# Patient Record
Sex: Female | Born: 1990 | Race: Black or African American | Hispanic: No | Marital: Single | Smoking: Never smoker
Health system: Southern US, Community
[De-identification: ages and names within clinical notes are randomized; demographics above are authoritative.]

## PROBLEM LIST (undated history)

## (undated) ENCOUNTER — Inpatient Hospital Stay (HOSPITAL_COMMUNITY): Payer: Self-pay

## (undated) DIAGNOSIS — M419 Scoliosis, unspecified: Secondary | ICD-10-CM

## (undated) DIAGNOSIS — Z3490 Encounter for supervision of normal pregnancy, unspecified, unspecified trimester: Secondary | ICD-10-CM

## (undated) DIAGNOSIS — Z789 Other specified health status: Secondary | ICD-10-CM

## (undated) HISTORY — PX: BACK SURGERY: SHX140

---

## 2012-05-30 ENCOUNTER — Inpatient Hospital Stay (HOSPITAL_COMMUNITY): Payer: BC Managed Care – PPO

## 2012-05-30 ENCOUNTER — Inpatient Hospital Stay (HOSPITAL_COMMUNITY)
Admission: AD | Admit: 2012-05-30 | Discharge: 2012-05-30 | Disposition: A | Discharge status: Home / Self Care | Payer: BC Managed Care – PPO | Source: Ambulatory Visit | Attending: Obstetrics & Gynecology | Admitting: Obstetrics & Gynecology

## 2012-05-30 ENCOUNTER — Encounter (HOSPITAL_COMMUNITY): Payer: Self-pay

## 2012-05-30 DIAGNOSIS — O99891 Other specified diseases and conditions complicating pregnancy: Secondary | ICD-10-CM | POA: Insufficient documentation

## 2012-05-30 DIAGNOSIS — O26899 Other specified pregnancy related conditions, unspecified trimester: Secondary | ICD-10-CM

## 2012-05-30 DIAGNOSIS — R109 Unspecified abdominal pain: Secondary | ICD-10-CM

## 2012-05-30 LAB — URINE MICROSCOPIC-ADD ON

## 2012-05-30 LAB — CBC
HCT: 33.9 % — ABNORMAL LOW (ref 36.0–46.0)
Hemoglobin: 10.9 g/dL — ABNORMAL LOW (ref 12.0–15.0)
MCH: 25.5 pg — ABNORMAL LOW (ref 26.0–34.0)
MCHC: 32.2 g/dL (ref 30.0–36.0)
MCV: 79.2 fL (ref 78.0–100.0)
Platelets: 197 K/uL (ref 150–400)
RBC: 4.28 MIL/uL (ref 3.87–5.11)
RDW: 13.8 % (ref 11.5–15.5)
WBC: 5.9 K/uL (ref 4.0–10.5)

## 2012-05-30 LAB — URINALYSIS, ROUTINE W REFLEX MICROSCOPIC
Nitrite: NEGATIVE
Specific Gravity, Urine: >1.03 — ABNORMAL HIGH (ref 1.005–1.030)
Urobilinogen, UA: 0.2 mg/dL (ref 0.0–1.0)

## 2012-05-30 LAB — HCG, QUANTITATIVE, PREGNANCY: hCG, Beta Chain, Quant, S: 5386 m[IU]/mL — ABNORMAL HIGH (ref <5)

## 2012-05-30 LAB — WET PREP, GENITAL

## 2012-05-30 LAB — POCT PREGNANCY, URINE: Preg Test, Ur: POSITIVE — AB

## 2012-05-30 NOTE — MAU Provider Note (Signed)
History     CSN: [id number]  Arrival date and time: 05/30/12 1644   First Provider Initiated Contact with Patient 05/30/12 1843      Chief Complaint  Patient presents with  . Abdominal Cramping   HPI  Pt is [redacted]w[redacted]d pregnant and presents with abdominal pain 3 days.  The pain comes and goes.  The pt has not taken anything for the pain.  Pt denies spotting or bleeding.  Pt denies nausea.  Pt denies pain with urination or constipation/diarrhea.  Pt last had sex when she got pregnant.  Pt denies fever or chills.  Pt has a new OB appointment scheduled with Dr. Langston Masker  History reviewed. No pertinent past medical history.  Past Surgical History  Procedure Date  . Back surgery     History reviewed. No pertinent family history.  History  Substance Use Topics  . Smoking status: Never Smoker   . Smokeless tobacco: Not on file  . Alcohol Use:     Allergies: No Known Allergies  No prescriptions prior to admission    Review of Systems  Constitutional: Negative for fever and chills.  Gastrointestinal: Positive for abdominal pain. Negative for nausea, vomiting, diarrhea and constipation.  Genitourinary: Negative for dysuria, urgency and frequency.  Neurological: Negative for headaches.   Physical Exam   Blood pressure 127/76, pulse 75, temperature 98.6 F (37 C), temperature source Oral, resp. rate 16, height 5' (1.524 m), weight 133 lb 2 oz (60.385 kg), last menstrual period 04/22/2012.  Physical Exam  Vitals reviewed. Constitutional: She is oriented to person, place, and time. She appears well-developed and well-nourished.  HENT:  Head: Normocephalic.  Eyes: Pupils are equal, round, and reactive to light.  Neck: Normal range of motion. Neck supple.  Cardiovascular: Normal rate.   Respiratory: Effort normal.  GI: Soft. She exhibits no distension. There is tenderness. There is no rebound and no guarding.       RLQ tender with palpation- no rebound  Genitourinary: Vagina  normal and uterus normal. No vaginal discharge found.       Mildly tender right adnexa  Musculoskeletal: Normal range of motion.  Neurological: She is alert and oriented to person, place, and time.  Skin: Skin is warm and dry.  Psychiatric: She has a normal mood and affect.    MAU Course  Procedures Results for orders placed during the hospital encounter of 05/30/12 (from the past 24 hour(s))  URINALYSIS, ROUTINE W REFLEX MICROSCOPIC     Status: Abnormal   Collection Time   05/30/12  5:00 PM      Component Value Range   Color, Urine YELLOW  YELLOW   APPearance CLEAR  CLEAR   Specific Gravity, Urine >1.030 (*) 1.005 - 1.030   pH 6.0  5.0 - 8.0   Glucose, UA NEGATIVE  NEGATIVE mg/dL   Hgb urine dipstick NEGATIVE  NEGATIVE   Bilirubin Urine NEGATIVE  NEGATIVE   Ketones, ur NEGATIVE  NEGATIVE mg/dL   Protein, ur NEGATIVE  NEGATIVE mg/dL   Urobilinogen, UA 0.2  0.0 - 1.0 mg/dL   Nitrite NEGATIVE  NEGATIVE   Leukocytes, UA TRACE (*) NEGATIVE  URINE MICROSCOPIC-ADD ON     Status: Abnormal   Collection Time   05/30/12  5:00 PM      Component Value Range   Squamous Epithelial / LPF FEW (*) RARE   WBC, UA 11-20  <3 WBC/hpf   RBC / HPF 3-6  <3 RBC/hpf   Bacteria, UA MANY (*) RARE  Urine-Other MUCOUS PRESENT    POCT PREGNANCY, URINE     Status: Abnormal   Collection Time   05/30/12  5:21 PM      Component Value Range   Preg Test, Ur POSITIVE (*) NEGATIVE  HCG, QUANTITATIVE, PREGNANCY     Status: Abnormal   Collection Time   05/30/12  6:40 PM      Component Value Range   hCG, Beta Chain, Quant, S 5386 (*) <5 mIU/mL  CBC     Status: Abnormal   Collection Time   05/30/12  6:40 PM      Component Value Range   WBC 5.9  4.0 - 10.5 K/uL   RBC 4.28  3.87 - 5.11 MIL/uL   Hemoglobin 10.9 (*) 12.0 - 15.0 g/dL   HCT 86.5 (*) 78.4 - 69.6 %   MCV 79.2  78.0 - 100.0 fL   MCH 25.5 (*) 26.0 - 34.0 pg   MCHC 32.2  30.0 - 36.0 g/dL   RDW 29.5  28.4 - 13.2 %   Platelets 197  150 - 400 K/uL    RADIOLOGY REPORT*  Clinical Data: Left-sided pain  OBSTETRIC <14 WK [place] AND TRANSVAGINAL OB US  Technique: Both transabdominal and transvaginal ultrasound  examinations were performed for complete evaluation of the  gestation as well as the maternal uterus, adnexal regions, and  pelvic cul-de-sac. Transvaginal technique was performed to assess  early pregnancy.  Comparison: None.  Intrauterine gestational sac: Visualized  Yolk sac: Visualized  Embryo: Not visualized  Cardiac Activity: Not available  Mean sac diameter measuring 7 mm corresponding to gestational age [redacted]  weeks 2 days. [place] EDC: 01/28/2013  Maternal uterus/adnexae:  No subchorionic hemorrhage.  Right ovary measures 5.1 x 4 x 4.6 cm. There is probable corpus  luteum cyst measures 1.8 cm.  Left ovary measures 3.5 x 1.6 x 2.3 cm.  Small amount of pelvic free fluid is noted.  IMPRESSION:  1. There is a single intrauterine gestation with mean sac diameter  measuring 7 mm corresponding to gestational age [redacted] weeks 2 days. No  embryonic pole is noted. A yolk sac is identified. EDC by  ultrasound is 01/28/2013.  No subchorionic hemorrhage. Probable corpus luteum cyst in the  right ovary measures 1.8 cm. Small pelvic free fluid.  Follow-up examination and correlation with beta HCG level is  recommended to assure viability.  Original Report Authenticated By: Natasha Mead, M.D. Results for orders placed during the hospital encounter of 05/30/12 (from the past 24 hour(s))  URINALYSIS, ROUTINE W REFLEX MICROSCOPIC     Status: Abnormal   Collection Time   05/30/12  5:00 PM      Component Value Range   Color, Urine YELLOW  YELLOW   APPearance CLEAR  CLEAR   Specific Gravity, Urine >1.030 (*) 1.005 - 1.030   pH 6.0  5.0 - 8.0   Glucose, UA NEGATIVE  NEGATIVE mg/dL   Hgb urine dipstick NEGATIVE  NEGATIVE   Bilirubin Urine NEGATIVE  NEGATIVE   Ketones, ur NEGATIVE  NEGATIVE mg/dL   Protein, ur NEGATIVE  NEGATIVE mg/dL   Urobilinogen,  UA 0.2  0.0 - 1.0 mg/dL   Nitrite NEGATIVE  NEGATIVE   Leukocytes, UA TRACE (*) NEGATIVE  URINE MICROSCOPIC-ADD ON     Status: Abnormal   Collection Time   05/30/12  5:00 PM      Component Value Range   Squamous Epithelial / LPF FEW (*) RARE   WBC, UA 11-20  <3  WBC/hpf   RBC / HPF 3-6  <3 RBC/hpf   Bacteria, UA MANY (*) RARE   Urine-Other MUCOUS PRESENT    POCT PREGNANCY, URINE     Status: Abnormal   Collection Time   05/30/12  5:21 PM      Component Value Range   Preg Test, Ur POSITIVE (*) NEGATIVE  HCG, QUANTITATIVE, PREGNANCY     Status: Abnormal   Collection Time   05/30/12  6:40 PM      Component Value Range   hCG, Beta Chain, Quant, S 5386 (*) <5 mIU/mL  CBC     Status: Abnormal   Collection Time   05/30/12  6:40 PM      Component Value Range   WBC 5.9  4.0 - 10.5 K/uL   RBC 4.28  3.87 - 5.11 MIL/uL   Hemoglobin 10.9 (*) 12.0 - 15.0 g/dL   HCT 16.1 (*) 09.6 - 04.5 %   MCV 79.2  78.0 - 100.0 fL   MCH 25.5 (*) 26.0 - 34.0 pg   MCHC 32.2  30.0 - 36.0 g/dL   RDW 40.9  81.1 - 91.4 %   Platelets 197  150 - 400 K/uL  WET PREP, GENITAL     Status: Abnormal   Collection Time   05/30/12  8:30 PM      Component Value Range   Yeast Wet Prep HPF POC NONE SEEN  NONE SEEN   Trich, Wet Prep NONE SEEN  NONE SEEN   Clue Cells Wet Prep HPF POC FEW (*) NONE SEEN   WBC, Wet Prep HPF POC FEW (*) NONE SEEN    Assessment and Plan  IUP [redacted]w[redacted]d with [facility] Abdominal pain in pregnancy F/u with NOB appointment as scheduled  Suzanne Flowers 05/30/2012, 6:43 PM

## 2012-05-30 NOTE — Progress Notes (Signed)
Written and verbal d/c instructions given and understanding voiced. May take Tylenol for cramping as needed

## 2012-05-30 NOTE — MAU Note (Signed)
Not currently in pain

## 2012-05-30 NOTE — MAU Note (Signed)
Pt states abd cramping began 1 week ago, G1, denies abnormal vag d/c or bleeding. +upt at home. Cramping intermittently, "very painful".

## 2012-06-02 LAB — URINE CULTURE: Colony Count: >=100000

## 2012-06-03 ENCOUNTER — Other Ambulatory Visit: Payer: Self-pay | Admitting: Medical

## 2012-06-03 DIAGNOSIS — N390 Urinary tract infection, site not specified: Secondary | ICD-10-CM

## 2012-06-03 MED ORDER — NITROFURANTOIN MONOHYD MACRO 100 MG PO CAPS: 100.0000 mg | ORAL_CAPSULE | Freq: Two times a day (BID) | ORAL | Status: DC

## 2012-07-07 ENCOUNTER — Inpatient Hospital Stay (HOSPITAL_COMMUNITY)
Admission: AD | Admit: 2012-07-07 | Discharge: 2012-07-07 | Disposition: A | Discharge status: Home / Self Care | Payer: BC Managed Care – PPO | Source: Ambulatory Visit | Attending: Family Medicine | Admitting: Family Medicine

## 2012-07-07 ENCOUNTER — Encounter (HOSPITAL_COMMUNITY): Payer: Self-pay | Admitting: *Deleted

## 2012-07-07 DIAGNOSIS — N760 Acute vaginitis: Secondary | ICD-10-CM | POA: Insufficient documentation

## 2012-07-07 DIAGNOSIS — R109 Unspecified abdominal pain: Secondary | ICD-10-CM | POA: Insufficient documentation

## 2012-07-07 DIAGNOSIS — A499 Bacterial infection, unspecified: Secondary | ICD-10-CM

## 2012-07-07 DIAGNOSIS — B9689 Other specified bacterial agents as the cause of diseases classified elsewhere: Secondary | ICD-10-CM | POA: Insufficient documentation

## 2012-07-07 DIAGNOSIS — N949 Unspecified condition associated with female genital organs and menstrual cycle: Secondary | ICD-10-CM | POA: Insufficient documentation

## 2012-07-07 HISTORY — DX: Other specified health status: Z78.9

## 2012-07-07 LAB — WET PREP, GENITAL
Trich, Wet Prep: NONE SEEN
Yeast Wet Prep HPF POC: NONE SEEN

## 2012-07-07 LAB — URINALYSIS, ROUTINE W REFLEX MICROSCOPIC
Hgb urine dipstick: NEGATIVE
Leukocytes, UA: NEGATIVE
Nitrite: NEGATIVE
Specific Gravity, Urine: 1.02 (ref 1.005–1.030)
Urobilinogen, UA: 1 mg/dL (ref 0.0–1.0)

## 2012-07-07 MED ORDER — METRONIDAZOLE 500 MG PO TABS: 500.0000 mg | ORAL_TABLET | Freq: Two times a day (BID) | ORAL | Status: DC

## 2012-07-07 NOTE — MAU Provider Note (Signed)
History     CSN: [id number]  Arrival date and time: 07/07/12 1204   None     Chief Complaint  Patient presents with  . Abdominal Pain  . Vaginal Discharge   HPI 22 y.o. G1P0 at [redacted]w[redacted]d with low abd pain and vaginal discharge, ongoing since last MAU visit in late January. Chlamydia was positive at that time, states she and her partner were both treated and abstained from intercourse as recommended. No bleeding. Has appointment for prenatal care with Dr. Langston Masker on 3/7.    Past Medical History  Diagnosis Date  . Medical history non-contributory     Past Surgical History  Procedure Laterality Date  . Back surgery    . No past surgeries      Family History  Problem Relation Age of Onset  . Diabetes Maternal Grandmother   . Diabetes Paternal Grandmother   . Diabetes Paternal Grandfather     History  Substance Use Topics  . Smoking status: Never Smoker   . Smokeless tobacco: Not on file  . Alcohol Use: No    Allergies: No Known Allergies  Prescriptions prior to admission  Medication Sig Dispense Refill  . acetaminophen (TYLENOL) 500 MG tablet Take 500 mg by mouth every 6 (six) hours as needed for pain.        Review of Systems  Constitutional: Negative.   Respiratory: Negative.   Cardiovascular: Negative.   Gastrointestinal: Positive for abdominal pain. Negative for nausea, vomiting, diarrhea and constipation.  Genitourinary: Negative for dysuria, urgency, frequency, hematuria and flank pain.       Positive vaginal discharge   Musculoskeletal: Negative.   Neurological: Negative.   Psychiatric/Behavioral: Negative.    Physical Exam   Blood pressure 109/61, pulse 86, temperature 98.3 F (36.8 C), temperature source Oral, resp. rate 18, height 5' (1.524 m), weight 128 lb 6.4 oz (58.242 kg), last menstrual period 04/22/2012.  Physical Exam  Nursing note and vitals reviewed. Constitutional: She is oriented to person, place, and time. She appears well-developed  and well-nourished. No distress.  Cardiovascular: Normal rate.   Respiratory: Effort normal.  GI: Soft. There is no tenderness.  Genitourinary: No bleeding around the vagina. Vaginal discharge (white, malodorous) found.  Cervix closed   Musculoskeletal: Normal range of motion.  Neurological: She is alert and oriented to person, place, and time.  Skin: Skin is warm and dry.  Psychiatric: She has a normal mood and affect.    MAU Course  Procedures  Results for orders placed during the hospital encounter of 07/07/12 (from the past 24 hour(s))  URINALYSIS, ROUTINE W REFLEX MICROSCOPIC     Status: Abnormal   Collection Time    07/07/12 12:20 PM      Result Value Range   Color, Urine YELLOW  YELLOW   APPearance HAZY (*) CLEAR   Specific Gravity, Urine 1.020  1.005 - 1.030   pH 6.5  5.0 - 8.0   Glucose, UA NEGATIVE  NEGATIVE mg/dL   Hgb urine dipstick NEGATIVE  NEGATIVE   Bilirubin Urine NEGATIVE  NEGATIVE   Ketones, ur NEGATIVE  NEGATIVE mg/dL   Protein, ur NEGATIVE  NEGATIVE mg/dL   Urobilinogen, UA 1.0  0.0 - 1.0 mg/dL   Nitrite NEGATIVE  NEGATIVE   Leukocytes, UA NEGATIVE  NEGATIVE  WET PREP, GENITAL     Status: Abnormal   Collection Time    07/07/12  1:13 PM      Result Value Range   Yeast Wet Prep HPF POC  NONE SEEN  NONE SEEN   Trich, Wet Prep NONE SEEN  NONE SEEN   Clue Cells Wet Prep HPF POC FEW (*) NONE SEEN   WBC, Wet Prep HPF POC FEW (*) NONE SEEN     Assessment and Plan   1. Bacterial vaginosis       Medication List    TAKE these medications       acetaminophen 500 MG tablet  Commonly known as:  TYLENOL  Take 500 mg by mouth every 6 (six) hours as needed for pain.     metroNIDAZOLE 500 MG tablet  Commonly known as:  FLAGYL  Take 1 tablet (500 mg total) by mouth 2 (two) times daily.            Follow-up Information   Follow up with MORRIS, MEGAN, DO. (as scheduled)    Contact information:   [address]  [address] [place] [address] [phone]         Georges Mouse 07/07/2012, 1:09 PM

## 2012-07-07 NOTE — MAU Note (Signed)
Patient presents with c/o abdominal pain since last being seen in MAU, vaginal discharge.

## 2012-07-07 NOTE — MAU Provider Note (Signed)
Chart reviewed and agree with management and plan.  

## 2012-07-19 ENCOUNTER — Inpatient Hospital Stay (HOSPITAL_COMMUNITY)
Admission: AD | Admit: 2012-07-19 | Discharge: 2012-07-19 | Disposition: A | Discharge status: Home / Self Care | Payer: BC Managed Care – PPO | Source: Ambulatory Visit | Attending: Obstetrics and Gynecology | Admitting: Obstetrics and Gynecology

## 2012-07-19 ENCOUNTER — Encounter (HOSPITAL_COMMUNITY): Payer: Self-pay | Admitting: *Deleted

## 2012-07-19 DIAGNOSIS — R109 Unspecified abdominal pain: Secondary | ICD-10-CM | POA: Insufficient documentation

## 2012-07-19 DIAGNOSIS — B3731 Acute candidiasis of vulva and vagina: Secondary | ICD-10-CM | POA: Insufficient documentation

## 2012-07-19 DIAGNOSIS — N938 Other specified abnormal uterine and vaginal bleeding: Secondary | ICD-10-CM | POA: Insufficient documentation

## 2012-07-19 LAB — URINE MICROSCOPIC-ADD ON

## 2012-07-19 LAB — URINALYSIS, ROUTINE W REFLEX MICROSCOPIC
Bilirubin Urine: NEGATIVE
Glucose, UA: NEGATIVE mg/dL
Specific Gravity, Urine: 1.025 (ref 1.005–1.030)
pH: 6 (ref 5.0–8.0)

## 2012-07-19 LAB — WET PREP, GENITAL
Trich, Wet Prep: NONE SEEN
Yeast Wet Prep HPF POC: NONE SEEN

## 2012-07-19 MED ORDER — FLUCONAZOLE 150 MG PO TABS
150.0000 mg | ORAL_TABLET | Freq: Once | ORAL | Status: AC
  Administered 2012-07-19: 150 mg via ORAL
  Filled 2012-07-19: qty 1

## 2012-07-19 NOTE — MAU Provider Note (Signed)
History     CSN: [id number]  Arrival date and time: 07/19/12 1912   None     Chief Complaint  Patient presents with  . Vaginal Bleeding  . Abdominal Pain   Patient is a 22 y.o. female presenting with vaginal bleeding and abdominal pain. The history is provided by the patient.  Vaginal Bleeding This is a new problem. The current episode started today. The problem has been unchanged. Associated symptoms include abdominal pain, congestion and headaches. Pertinent negatives include no chest pain, chills, coughing, fever, myalgias, nausea, neck pain, rash, sore throat, vomiting or weakness. Nothing aggravates the symptoms. She has tried nothing for the symptoms.  Abdominal Pain The primary symptoms of the illness include abdominal pain, dysuria and vaginal bleeding. The primary symptoms of the illness do not include fever, shortness of breath, nausea, vomiting or diarrhea.  The dysuria is not associated with frequency or urgency.  Additional symptoms associated with the illness include constipation. Symptoms associated with the illness do not include chills, heartburn, urgency, frequency or back pain.   Patient states that about 6:15 tonight went to the toilet and noted bright red blood on the tissue when she wiped. Has not had any other signs of bleeding since then. Associated symptoms include abdominal cramping that she rates as 4/10.   OB History   Grav Para Term Preterm Abortions TAB SAB Ect Mult Living   1               Past Medical History  Diagnosis Date  . Medical history non-contributory     Past Surgical History  Procedure Laterality Date  . Back surgery      Family History  Problem Relation Age of Onset  . Diabetes Maternal Grandmother   . Diabetes Paternal Grandmother   . Diabetes Paternal Grandfather     History  Substance Use Topics  . Smoking status: Never Smoker   . Smokeless tobacco: Not on file  . Alcohol Use: No    Allergies: No Known  Allergies  Prescriptions prior to admission  Medication Sig Dispense Refill  . acetaminophen (TYLENOL) 500 MG tablet Take 500 mg by mouth every 6 (six) hours as needed for pain.      . [DISCONTINUED] metroNIDAZOLE (FLAGYL) 500 MG tablet Take 1 tablet (500 mg total) by mouth 2 (two) times daily.  14 tablet  0    Review of Systems  Constitutional: Negative for fever, chills and weight loss.  HENT: Positive for congestion. Negative for ear pain, nosebleeds, sore throat and neck pain.   Eyes: Negative for blurred vision, double vision, photophobia and pain.  Respiratory: Negative for cough, shortness of breath and wheezing.   Cardiovascular: Negative for chest pain, palpitations and leg swelling.  Gastrointestinal: Positive for abdominal pain and constipation. Negative for heartburn, nausea, vomiting and diarrhea.  Genitourinary: Positive for dysuria and vaginal bleeding. Negative for urgency and frequency.  Musculoskeletal: Negative for myalgias and back pain.  Skin: Negative for itching and rash.  Neurological: Positive for headaches. Negative for dizziness, sensory change, speech change, seizures and weakness.  Endo/Heme/Allergies: Does not bruise/bleed easily.  Psychiatric/Behavioral: Negative for depression and substance abuse. The patient is not nervous/anxious.    Blood pressure 134/78, pulse 92, temperature 98.4 F (36.9 C), temperature source Oral, resp. rate 20, height 5' (1.524 m), weight 133 lb 6.4 oz (60.51 kg), last menstrual period 04/22/2012, SpO2 100.00%.  Physical Exam  Nursing note and vitals reviewed. Constitutional: She is oriented to person, place, and  time. She appears well-developed and well-nourished. No distress.  HENT:  Head: Normocephalic and atraumatic.  Eyes: EOM are normal.  Neck: Neck supple.  Cardiovascular: Normal rate.   Respiratory: Effort normal.  GI: Soft. There is no tenderness.  Genitourinary:  External genitalia without lesions. There is  erythema noted. Thick white discharge vaginal vault. No bleeding noted. Cervix long, closed, no CMT, no adnexal tenderness. Uterus consistent with dates.  Musculoskeletal: Normal range of motion.  Neurological: She is alert and oriented to person, place, and time.  Skin: Skin is warm and dry.  Psychiatric: She has a normal mood and affect. Her behavior is normal. Judgment and thought content normal.   Results for orders placed during the hospital encounter of 07/19/12 (from the past 24 hour(s))  URINALYSIS, ROUTINE W REFLEX MICROSCOPIC     Status: Abnormal   Collection Time    07/19/12  7:40 PM      Result Value Range   Color, Urine YELLOW  YELLOW   APPearance HAZY (*) CLEAR   Specific Gravity, Urine 1.025  1.005 - 1.030   pH 6.0  5.0 - 8.0   Glucose, UA NEGATIVE  NEGATIVE mg/dL   Hgb urine dipstick TRACE (*) NEGATIVE   Bilirubin Urine NEGATIVE  NEGATIVE   Ketones, ur NEGATIVE  NEGATIVE mg/dL   Protein, ur NEGATIVE  NEGATIVE mg/dL   Urobilinogen, UA 0.2  0.0 - 1.0 mg/dL   Nitrite NEGATIVE  NEGATIVE   Leukocytes, UA MODERATE (*) NEGATIVE  URINE MICROSCOPIC-ADD ON     Status: Abnormal   Collection Time    07/19/12  7:40 PM      Result Value Range   Squamous Epithelial / LPF FEW (*) RARE   WBC, UA 0-2  <3 WBC/hpf   RBC / HPF 0-2  <3 RBC/hpf   Urine-Other MUCOUS PRESENT    WET PREP, GENITAL     Status: Abnormal   Collection Time    07/19/12  8:20 PM      Result Value Range   Yeast Wet Prep HPF POC NONE SEEN  NONE SEEN   Trich, Wet Prep NONE SEEN  NONE SEEN   Clue Cells Wet Prep HPF POC FEW (*) NONE SEEN   WBC, Wet Prep HPF POC MODERATE (*) NONE SEEN     Assessment: 22 y.o. female with one episode of noting blood on tissue when wiped   No bleeding noted   Vaginal mucosa with erythema   Monilia vaginosis  Plan:  Diflucan 150 mg PO now    Urine sent for culture   D/c home to follow up in the office Procedures  MDM I discussed clinical and lab findings with Dr. Arelia Sneddon.  There is no bleeding noted on exam and cervix is closed. She is not exhibiting any pain at this time. Discussed bleeding in second trimester and signs of threatened AB. Will treat monilia infection and patient will follow up in the office.   NEESE,HOPE, RN, FNP, [facility] 07/19/2012, 8:59 PM

## 2012-07-19 NOTE — Progress Notes (Signed)
No blood on panty liner in Triage.

## 2012-07-19 NOTE — MAU Note (Signed)
Was at work and went to [facility] and saw bright blood on tissue when wiped. Afterward started having abd cramps. Still cramping some.

## 2012-07-22 LAB — OB RESULTS CONSOLE GC/CHLAMYDIA: Gonorrhea: NEGATIVE

## 2012-07-22 LAB — OB RESULTS CONSOLE ABO/RH: RH Type: POSITIVE

## 2012-07-22 LAB — OB RESULTS CONSOLE ANTIBODY SCREEN: Antibody Screen: NEGATIVE

## 2012-12-09 ENCOUNTER — Ambulatory Visit (HOSPITAL_COMMUNITY)
Admission: RE | Admit: 2012-12-09 | Discharge: 2012-12-09 | Disposition: A | Discharge status: Home / Self Care | Payer: BC Managed Care – PPO | Source: Ambulatory Visit | Attending: Obstetrics and Gynecology | Admitting: Obstetrics and Gynecology

## 2012-12-09 ENCOUNTER — Other Ambulatory Visit (HOSPITAL_COMMUNITY): Payer: Self-pay | Admitting: Obstetrics and Gynecology

## 2012-12-09 DIAGNOSIS — O1203 Gestational edema, third trimester: Secondary | ICD-10-CM

## 2012-12-09 DIAGNOSIS — M7989 Other specified soft tissue disorders: Secondary | ICD-10-CM | POA: Insufficient documentation

## 2012-12-09 DIAGNOSIS — O9989 Other specified diseases and conditions complicating pregnancy, childbirth and the puerperium: Secondary | ICD-10-CM | POA: Insufficient documentation

## 2012-12-09 NOTE — Progress Notes (Signed)
VASCULAR LAB PRELIMINARY  PRELIMINARY  PRELIMINARY  PRELIMINARY  Right lower extremity venous duplex completed.    Preliminary report:  Right:  No evidence of DVT, superficial thrombosis, or Baker's cyst.  Suzanne Flowers, RVS 12/09/2012, 3:18 PM

## 2012-12-22 ENCOUNTER — Encounter (HOSPITAL_COMMUNITY): Payer: Self-pay | Admitting: *Deleted

## 2012-12-22 ENCOUNTER — Inpatient Hospital Stay (HOSPITAL_COMMUNITY)
Admission: AD | Admit: 2012-12-22 | Discharge: 2012-12-22 | Disposition: A | Discharge status: Home / Self Care | Payer: BC Managed Care – PPO | Source: Ambulatory Visit | Attending: Obstetrics and Gynecology | Admitting: Obstetrics and Gynecology

## 2012-12-22 ENCOUNTER — Inpatient Hospital Stay (HOSPITAL_COMMUNITY): Payer: BC Managed Care – PPO

## 2012-12-22 DIAGNOSIS — K649 Unspecified hemorrhoids: Secondary | ICD-10-CM

## 2012-12-22 DIAGNOSIS — N9089 Other specified noninflammatory disorders of vulva and perineum: Secondary | ICD-10-CM

## 2012-12-22 DIAGNOSIS — O4700 False labor before 37 completed weeks of gestation, unspecified trimester: Secondary | ICD-10-CM | POA: Insufficient documentation

## 2012-12-22 DIAGNOSIS — O4690 Antepartum hemorrhage, unspecified, unspecified trimester: Secondary | ICD-10-CM | POA: Insufficient documentation

## 2012-12-22 LAB — URINE MICROSCOPIC-ADD ON

## 2012-12-22 LAB — WET PREP, GENITAL: Clue Cells Wet Prep HPF POC: NONE SEEN

## 2012-12-22 LAB — URINALYSIS, ROUTINE W REFLEX MICROSCOPIC
Bilirubin Urine: NEGATIVE
Hgb urine dipstick: NEGATIVE
Ketones, ur: NEGATIVE mg/dL
Protein, ur: NEGATIVE mg/dL
Urobilinogen, UA: 0.2 mg/dL (ref 0.0–1.0)

## 2012-12-22 MED ORDER — DOCUSATE SODIUM 100 MG PO CAPS: 100.0000 mg | ORAL_CAPSULE | Freq: Two times a day (BID) | ORAL | Status: DC

## 2012-12-22 MED ORDER — VALACYCLOVIR HCL 1 G PO TABS: 1000.0000 mg | ORAL_TABLET | Freq: Every day | ORAL | Status: AC

## 2012-12-22 NOTE — MAU Provider Note (Signed)
History     CSN: [id number]  Arrival date and time: 12/22/12 1850   First Provider Initiated Contact with Patient 12/22/12 2021      Chief Complaint  Patient presents with  . Vaginal Bleeding   Vaginal Bleeding Pertinent negatives include no abdominal pain, chills, dysuria, fever, frequency, headaches, hematuria, nausea, rash, urgency or vomiting.   Suzanne Flowers is a 22 y.o. G1P0 at [redacted]w[redacted]d presents for evaluation of vaginal bleeding and contractions since last week. Patient reports that today she's having a bowel movement and then had blood in the toilet and with wiping. Patient reports no bleeding since then. Good fetal movement, no loss of fluid and patient reports she feels her abdomen taking every 10 minutes but nonpainful. Patient also reports painful Braxton Hicks contractions overnight. Patient also reports that she has herpes lesions on her vulva as well.  Otherwise patient in normal state of health.  OB History   Grav Para Term Preterm Abortions TAB SAB Ect Mult Living   1               Past Medical History  Diagnosis Date  . Medical history non-contributory   . HSV infection     Past Surgical History  Procedure Laterality Date  . Back surgery      Family History  Problem Relation Age of Onset  . Diabetes Maternal Grandmother   . Diabetes Paternal Grandmother   . Diabetes Paternal Grandfather     History  Substance Use Topics  . Smoking status: Never Smoker   . Smokeless tobacco: Not on file  . Alcohol Use: No    Allergies: No Known Allergies  Prescriptions prior to admission  Medication Sig Dispense Refill  . Prenatal Vit-Fe Fumarate-FA (MULTIVITAMIN-PRENATAL) 27-0.8 MG TABS tablet Take 1 tablet by mouth daily at 12 noon.      Marland Kitchen acetaminophen (TYLENOL) 500 MG tablet Take 500 mg by mouth every 6 (six) hours as needed for pain.        Review of Systems  Constitutional: Negative for fever and chills.  Respiratory: Negative for sputum  production and shortness of breath.   Cardiovascular: Negative for chest pain and palpitations.  Gastrointestinal: Negative for heartburn, nausea, vomiting and abdominal pain.  Genitourinary: Positive for vaginal bleeding. Negative for dysuria, urgency, frequency and hematuria.  Skin: Negative for rash.  Neurological: Negative for headaches.   Physical Exam   Blood pressure 120/81, pulse 90, temperature 98.1 F (36.7 C), temperature source Oral, resp. rate 18, height 5' (1.524 m), weight 71.396 kg (157 lb 6.4 oz), last menstrual period 04/22/2012.  Physical Exam  Nursing note and vitals reviewed. Constitutional: She appears well-developed and well-nourished. No distress.  Genitourinary: Rectal exam shows no internal hemorrhoid (Patient with nontender hemorrhoid at the anus).    No labial fusion. There is no rash, tenderness, lesion or injury on the right labia. There is lesion on the left labia. There is no rash, tenderness or injury on the left labia. Cervix exhibits discharge (White discharge). Cervix exhibits no motion tenderness and no friability. No erythema, tenderness or bleeding (erythematous red ulcers nonbleeding) around the vagina. No foreign body around the vagina. No signs of injury around the vagina. No vaginal discharge found.  Painless ulcers on her left labia  Skin: She is not diaphoretic.    MAU Course  Procedures  MDM Patient here for evaluation of vaginal bleeding. Patient reports no history of placenta previa however unable to confirm via chart. Patient has a  hemorrhoid likely etiology of her bleeding. No current bleeding and no bleeding in the vault. However unable to confirm placenta will order ultrasound for evaluation. Also nonpainful ulcer left labia. Will order RPR GC and chlamydia for further evaluation. Patient reports that it is secondary to herpes and patient states that she should be on Valtrex.  Assessment and Plan  Suzanne Flowers is a 22 y.o. G1P0  at [redacted]w[redacted]d with evaluation of vaginal bleeding. Suspicous hx more consistent with rectal bleeding given hemorrhoid however, no active bleeding. Will also reevaluate for GC/C and RPR given painless ulcer on left labia. Unlikely ulcer source of bleeding. [place] reassuring for normal placentation and no evidence of abruption. Pt will be discharged home. Discussed with Pt's primary OB.  Lesions: nonpainful ulcers, ddx chancre/syphillis, chancroid, HSV. Recently improved with valtrex, will restart today.  Hemmorrhoid: start on colace, increase fiber.   Tawana Scale 12/22/2012, 8:36 PM

## 2012-12-22 NOTE — MAU Note (Signed)
Pt G1 at 34.6 wks with bright red bleeding this afternoon.  Denies any problems with pregnancy.  No pain.

## 2012-12-23 LAB — RPR: RPR Ser Ql: NONREACTIVE

## 2012-12-24 LAB — URINE CULTURE: Culture: NO GROWTH

## 2012-12-25 LAB — OB RESULTS CONSOLE GBS: GBS: NEGATIVE

## 2013-01-18 ENCOUNTER — Encounter (HOSPITAL_COMMUNITY)
Admission: AD | Disposition: A | Discharge status: Home / Self Care | Payer: Self-pay | Source: Ambulatory Visit | Attending: Obstetrics and Gynecology

## 2013-01-18 ENCOUNTER — Encounter (HOSPITAL_COMMUNITY): Payer: Self-pay | Admitting: Anesthesiology

## 2013-01-18 ENCOUNTER — Inpatient Hospital Stay (HOSPITAL_COMMUNITY): Payer: BC Managed Care – PPO | Admitting: Anesthesiology

## 2013-01-18 ENCOUNTER — Inpatient Hospital Stay (HOSPITAL_COMMUNITY)
Admission: AD | Admit: 2013-01-18 | Discharge: 2013-01-21 | DRG: 371 | Disposition: A | Discharge status: Home / Self Care | Payer: BC Managed Care – PPO | Source: Ambulatory Visit | Attending: Obstetrics and Gynecology | Admitting: Obstetrics and Gynecology

## 2013-01-18 ENCOUNTER — Encounter (HOSPITAL_COMMUNITY): Payer: Self-pay | Admitting: Family Medicine

## 2013-01-18 LAB — CBC
Platelets: 169 10*3/uL (ref 150–400)
RDW: 14.5 % (ref 11.5–15.5)
WBC: 8.5 10*3/uL (ref 4.0–10.5)

## 2013-01-18 SURGERY — Surgical Case
Anesthesia: Spinal | Site: Abdomen | Wound class: Clean Contaminated

## 2013-01-18 MED ORDER — DIPHENHYDRAMINE HCL 25 MG PO CAPS: 25.0000 mg | ORAL_CAPSULE | Freq: Four times a day (QID) | ORAL | Status: DC | PRN

## 2013-01-18 MED ORDER — OXYTOCIN 10 UNIT/ML IJ SOLN
40.0000 [IU] | INTRAVENOUS | Status: DC | PRN
  Administered 2013-01-18: 40 [IU] via INTRAVENOUS

## 2013-01-18 MED ORDER — OXYTOCIN 40 UNITS IN LACTATED RINGERS INFUSION - SIMPLE MED
62.5000 mL/h | INTRAVENOUS | Status: DC
  Filled 2013-01-18: qty 1000

## 2013-01-18 MED ORDER — NALBUPHINE SYRINGE 5 MG/0.5 ML
INJECTION | INTRAMUSCULAR | Status: AC
  Administered 2013-01-18: 10 mg via SUBCUTANEOUS
  Filled 2013-01-18: qty 1

## 2013-01-18 MED ORDER — OXYCODONE-ACETAMINOPHEN 5-325 MG PO TABS: 1.0000 | ORAL_TABLET | ORAL | Status: DC | PRN

## 2013-01-18 MED ORDER — SIMETHICONE 80 MG PO CHEW
80.0000 mg | CHEWABLE_TABLET | ORAL | Status: DC | PRN
  Administered 2013-01-19 – 2013-01-21 (×3): 80 mg via ORAL

## 2013-01-18 MED ORDER — CEFAZOLIN SODIUM-DEXTROSE 2-3 GM-% IV SOLR
2.0000 g | Freq: Once | INTRAVENOUS | Status: AC
  Administered 2013-01-18 (×2): 2 g via INTRAVENOUS
  Filled 2013-01-18: qty 50

## 2013-01-18 MED ORDER — MIDAZOLAM HCL 2 MG/2ML IJ SOLN: 0.5000 mg | Freq: Once | INTRAMUSCULAR | Status: DC | PRN

## 2013-01-18 MED ORDER — NALOXONE HCL 0.4 MG/ML IJ SOLN
INTRAMUSCULAR | Status: AC
  Filled 2013-01-18: qty 1

## 2013-01-18 MED ORDER — FENTANYL CITRATE 0.05 MG/ML IJ SOLN
INTRAMUSCULAR | Status: AC
  Filled 2013-01-18: qty 2

## 2013-01-18 MED ORDER — NALBUPHINE HCL 10 MG/ML IJ SOLN
5.0000 mg | INTRAMUSCULAR | Status: DC | PRN
  Administered 2013-01-18: 10 mg via SUBCUTANEOUS
  Filled 2013-01-18: qty 1

## 2013-01-18 MED ORDER — SODIUM CHLORIDE 0.9 % IJ SOLN: 3.0000 mL | INTRAMUSCULAR | Status: DC | PRN

## 2013-01-18 MED ORDER — CITRIC ACID-SODIUM CITRATE 334-500 MG/5ML PO SOLN
30.0000 mL | ORAL | Status: DC | PRN
  Administered 2013-01-18: 30 mL via ORAL
  Filled 2013-01-18: qty 15

## 2013-01-18 MED ORDER — ONDANSETRON HCL 4 MG PO TABS: 4.0000 mg | ORAL_TABLET | ORAL | Status: DC | PRN

## 2013-01-18 MED ORDER — SENNOSIDES-DOCUSATE SODIUM 8.6-50 MG PO TABS
2.0000 | ORAL_TABLET | ORAL | Status: DC
  Administered 2013-01-19 – 2013-01-21 (×3): 2 via ORAL

## 2013-01-18 MED ORDER — FENTANYL CITRATE 0.05 MG/ML IJ SOLN
INTRAMUSCULAR | Status: DC | PRN
  Administered 2013-01-18: 25 ug via INTRATHECAL

## 2013-01-18 MED ORDER — LIDOCAINE HCL (PF) 1 % IJ SOLN
30.0000 mL | INTRAMUSCULAR | Status: DC | PRN
  Filled 2013-01-18: qty 30

## 2013-01-18 MED ORDER — SCOPOLAMINE 1 MG/3DAYS TD PT72
1.0000 | MEDICATED_PATCH | Freq: Once | TRANSDERMAL | Status: DC
  Administered 2013-01-18: 1.5 mg via TRANSDERMAL

## 2013-01-18 MED ORDER — SCOPOLAMINE 1 MG/3DAYS TD PT72
MEDICATED_PATCH | TRANSDERMAL | Status: AC
  Administered 2013-01-18: 1.5 mg via TRANSDERMAL
  Filled 2013-01-18: qty 1

## 2013-01-18 MED ORDER — NALBUPHINE HCL 10 MG/ML IJ SOLN
5.0000 mg | INTRAMUSCULAR | Status: DC | PRN
Start: 2013-01-18 — End: 2013-01-21
  Filled 2013-01-18: qty 1

## 2013-01-18 MED ORDER — ONDANSETRON HCL 4 MG/2ML IJ SOLN: 4.0000 mg | Freq: Three times a day (TID) | INTRAMUSCULAR | Status: DC | PRN

## 2013-01-18 MED ORDER — FLEET ENEMA 7-19 GM/118ML RE ENEM: 1.0000 | ENEMA | Freq: Every day | RECTAL | Status: DC | PRN

## 2013-01-18 MED ORDER — PRENATAL MULTIVITAMIN CH
1.0000 | ORAL_TABLET | Freq: Every day | ORAL | Status: DC
  Administered 2013-01-19 – 2013-01-20 (×2): 1 via ORAL
  Filled 2013-01-18 (×2): qty 1

## 2013-01-18 MED ORDER — ACETAMINOPHEN 325 MG PO TABS: 650.0000 mg | ORAL_TABLET | ORAL | Status: DC | PRN

## 2013-01-18 MED ORDER — PROMETHAZINE HCL 25 MG/ML IJ SOLN: 6.2500 mg | INTRAMUSCULAR | Status: DC | PRN

## 2013-01-18 MED ORDER — FENTANYL CITRATE 0.05 MG/ML IJ SOLN: 25.0000 ug | INTRAMUSCULAR | Status: DC | PRN

## 2013-01-18 MED ORDER — KETOROLAC TROMETHAMINE 30 MG/ML IJ SOLN
30.0000 mg | Freq: Four times a day (QID) | INTRAMUSCULAR | Status: AC | PRN
  Administered 2013-01-18: 30 mg via INTRAMUSCULAR

## 2013-01-18 MED ORDER — PHENYLEPHRINE 40 MCG/ML (10ML) SYRINGE FOR IV PUSH (FOR BLOOD PRESSURE SUPPORT)
PREFILLED_SYRINGE | INTRAVENOUS | Status: AC
  Filled 2013-01-18: qty 5

## 2013-01-18 MED ORDER — ONDANSETRON HCL 4 MG/2ML IJ SOLN
4.0000 mg | Freq: Four times a day (QID) | INTRAMUSCULAR | Status: DC | PRN
  Administered 2013-01-18: 4 mg via INTRAVENOUS
  Filled 2013-01-18: qty 2

## 2013-01-18 MED ORDER — DIPHENHYDRAMINE HCL 50 MG/ML IJ SOLN: 25.0000 mg | INTRAMUSCULAR | Status: DC | PRN

## 2013-01-18 MED ORDER — 0.9 % SODIUM CHLORIDE (POUR BTL) OPTIME
TOPICAL | Status: DC | PRN
  Administered 2013-01-18: 1000 mL

## 2013-01-18 MED ORDER — KETOROLAC TROMETHAMINE 30 MG/ML IJ SOLN: 30.0000 mg | Freq: Four times a day (QID) | INTRAMUSCULAR | Status: AC | PRN

## 2013-01-18 MED ORDER — ONDANSETRON HCL 4 MG/2ML IJ SOLN
INTRAMUSCULAR | Status: DC | PRN
  Administered 2013-01-18: 4 mg via INTRAVENOUS

## 2013-01-18 MED ORDER — DIPHENHYDRAMINE HCL 50 MG/ML IJ SOLN: 12.5000 mg | INTRAMUSCULAR | Status: DC | PRN

## 2013-01-18 MED ORDER — OXYTOCIN 40 UNITS IN LACTATED RINGERS INFUSION - SIMPLE MED: 62.5000 mL/h | INTRAVENOUS | Status: AC

## 2013-01-18 MED ORDER — ONDANSETRON HCL 4 MG/2ML IJ SOLN: 4.0000 mg | INTRAMUSCULAR | Status: DC | PRN

## 2013-01-18 MED ORDER — MORPHINE SULFATE (PF) 0.5 MG/ML IJ SOLN
INTRAMUSCULAR | Status: DC | PRN
  Administered 2013-01-18: .15 mg via INTRATHECAL

## 2013-01-18 MED ORDER — DIPHENHYDRAMINE HCL 25 MG PO CAPS: 25.0000 mg | ORAL_CAPSULE | ORAL | Status: DC | PRN

## 2013-01-18 MED ORDER — SIMETHICONE 80 MG PO CHEW
80.0000 mg | CHEWABLE_TABLET | ORAL | Status: DC
  Administered 2013-01-21: 80 mg via ORAL

## 2013-01-18 MED ORDER — MEPERIDINE HCL 25 MG/ML IJ SOLN: 6.2500 mg | INTRAMUSCULAR | Status: DC | PRN

## 2013-01-18 MED ORDER — OXYTOCIN 10 UNIT/ML IJ SOLN
INTRAMUSCULAR | Status: AC
  Filled 2013-01-18: qty 4

## 2013-01-18 MED ORDER — KETOROLAC TROMETHAMINE 30 MG/ML IJ SOLN
INTRAMUSCULAR | Status: AC
  Administered 2013-01-18: 30 mg via INTRAMUSCULAR
  Filled 2013-01-18: qty 1

## 2013-01-18 MED ORDER — LACTATED RINGERS IV SOLN
INTRAVENOUS | Status: DC
  Administered 2013-01-18: 125 mL/h via INTRAVENOUS
  Administered 2013-01-18 (×5): via INTRAVENOUS

## 2013-01-18 MED ORDER — OXYCODONE-ACETAMINOPHEN 5-325 MG PO TABS
1.0000 | ORAL_TABLET | ORAL | Status: DC | PRN
  Administered 2013-01-19: 2 via ORAL
  Administered 2013-01-20: 1 via ORAL
  Filled 2013-01-18: qty 1
  Filled 2013-01-18: qty 2

## 2013-01-18 MED ORDER — MORPHINE SULFATE 0.5 MG/ML IJ SOLN
INTRAMUSCULAR | Status: AC
  Filled 2013-01-18: qty 10

## 2013-01-18 MED ORDER — PHENYLEPHRINE HCL 10 MG/ML IJ SOLN
INTRAMUSCULAR | Status: DC | PRN
  Administered 2013-01-18 (×7): 40 ug via INTRAVENOUS

## 2013-01-18 MED ORDER — DIBUCAINE 1 % RE OINT: 1.0000 "application " | TOPICAL_OINTMENT | RECTAL | Status: DC | PRN

## 2013-01-18 MED ORDER — NALOXONE HCL 1 MG/ML IJ SOLN: 1.0000 ug/kg/h | INTRAMUSCULAR | Status: DC | PRN

## 2013-01-18 MED ORDER — MENTHOL 3 MG MT LOZG: 1.0000 | LOZENGE | OROMUCOSAL | Status: DC | PRN

## 2013-01-18 MED ORDER — LANOLIN HYDROUS EX OINT: 1.0000 "application " | TOPICAL_OINTMENT | CUTANEOUS | Status: DC | PRN

## 2013-01-18 MED ORDER — METOCLOPRAMIDE HCL 5 MG/ML IJ SOLN: 10.0000 mg | Freq: Three times a day (TID) | INTRAMUSCULAR | Status: DC | PRN

## 2013-01-18 MED ORDER — LACTATED RINGERS IV SOLN
500.0000 mL | INTRAVENOUS | Status: DC | PRN
  Administered 2013-01-18: 500 mL via INTRAVENOUS
  Administered 2013-01-18: 1000 mL via INTRAVENOUS

## 2013-01-18 MED ORDER — MEASLES, MUMPS & RUBELLA VAC ~~LOC~~ INJ: 0.5000 mL | INJECTION | Freq: Once | SUBCUTANEOUS | Status: DC

## 2013-01-18 MED ORDER — NALOXONE HCL 0.4 MG/ML IJ SOLN: 0.4000 mg | INTRAMUSCULAR | Status: DC | PRN

## 2013-01-18 MED ORDER — BUPIVACAINE IN DEXTROSE 0.75-8.25 % IT SOLN
INTRATHECAL | Status: DC | PRN
  Administered 2013-01-18: 1.5 mL via INTRATHECAL

## 2013-01-18 MED ORDER — OXYTOCIN BOLUS FROM INFUSION: 500.0000 mL | INTRAVENOUS | Status: DC

## 2013-01-18 MED ORDER — WITCH HAZEL-GLYCERIN EX PADS: 1.0000 "application " | MEDICATED_PAD | CUTANEOUS | Status: DC | PRN

## 2013-01-18 MED ORDER — BUTORPHANOL TARTRATE 1 MG/ML IJ SOLN
1.0000 mg | INTRAMUSCULAR | Status: DC | PRN
  Administered 2013-01-18 (×3): 1 mg via INTRAVENOUS
  Filled 2013-01-18 (×3): qty 1

## 2013-01-18 MED ORDER — DEXTROSE IN LACTATED RINGERS 5 % IV SOLN
INTRAVENOUS | Status: DC
  Administered 2013-01-18 – 2013-01-19 (×2): via INTRAVENOUS

## 2013-01-18 MED ORDER — SIMETHICONE 80 MG PO CHEW
80.0000 mg | CHEWABLE_TABLET | Freq: Three times a day (TID) | ORAL | Status: DC
  Administered 2013-01-19 – 2013-01-21 (×7): 80 mg via ORAL

## 2013-01-18 MED ORDER — IBUPROFEN 600 MG PO TABS
600.0000 mg | ORAL_TABLET | Freq: Four times a day (QID) | ORAL | Status: DC
  Administered 2013-01-19 – 2013-01-21 (×8): 600 mg via ORAL
  Filled 2013-01-18 (×9): qty 1

## 2013-01-18 MED ORDER — MEDROXYPROGESTERONE ACETATE 150 MG/ML IM SUSP: 150.0000 mg | INTRAMUSCULAR | Status: DC | PRN

## 2013-01-18 MED ORDER — TETANUS-DIPHTH-ACELL PERTUSSIS 5-2.5-18.5 LF-MCG/0.5 IM SUSP: 0.5000 mL | Freq: Once | INTRAMUSCULAR | Status: DC

## 2013-01-18 MED ORDER — ONDANSETRON HCL 4 MG/2ML IJ SOLN
INTRAMUSCULAR | Status: AC
  Filled 2013-01-18: qty 2

## 2013-01-18 MED ORDER — LACTATED RINGERS IV SOLN
INTRAVENOUS | Status: DC | PRN
  Administered 2013-01-18: 18:00:00 via INTRAVENOUS

## 2013-01-18 MED ORDER — IBUPROFEN 600 MG PO TABS: 600.0000 mg | ORAL_TABLET | Freq: Four times a day (QID) | ORAL | Status: DC | PRN

## 2013-01-18 SURGICAL SUPPLY — 33 items
CLAMP CORD UMBIL (MISCELLANEOUS) IMPLANT
CLEANER TIP ELECTROSURG 2X2 (MISCELLANEOUS) ×2 IMPLANT
CLOTH BEACON ORANGE TIMEOUT ST (SAFETY) ×2 IMPLANT
DERMABOND ADVANCED (GAUZE/BANDAGES/DRESSINGS) ×1
DERMABOND ADVANCED .7 DNX12 (GAUZE/BANDAGES/DRESSINGS) ×1 IMPLANT
DEVICE BLD TRNS LUER ATTCH (MISCELLANEOUS) ×2 IMPLANT
DRAPE LG THREE QUARTER DISP (DRAPES) ×4 IMPLANT
DRSG OPSITE POSTOP 4X10 (GAUZE/BANDAGES/DRESSINGS) ×2 IMPLANT
DURAPREP 26ML APPLICATOR (WOUND CARE) ×2 IMPLANT
ELECT REM PT RETURN 9FT ADLT (ELECTROSURGICAL) ×2
ELECTRODE REM PT RTRN 9FT ADLT (ELECTROSURGICAL) ×1 IMPLANT
EXTRACTOR VACUUM M CUP 4 TUBE (SUCTIONS) IMPLANT
GLOVE BIO SURGEON STRL SZ 6.5 (GLOVE) ×2 IMPLANT
GLOVE BIOGEL PI IND STRL 7.0 (GLOVE) ×4 IMPLANT
GLOVE BIOGEL PI INDICATOR 7.0 (GLOVE) ×4
GOWN PREVENTION PLUS XLARGE (GOWN DISPOSABLE) IMPLANT
GOWN STRL REIN XL XLG (GOWN DISPOSABLE) ×2 IMPLANT
KIT ABG SYR 3ML LUER SLIP (SYRINGE) IMPLANT
NEEDLE HYPO 25X5/8 SAFETYGLIDE (NEEDLE) IMPLANT
NS IRRIG 1000ML POUR BTL (IV SOLUTION) ×2 IMPLANT
PACK C SECTION WH (CUSTOM PROCEDURE TRAY) ×2 IMPLANT
PAD OB MATERNITY 4.3X12.25 (PERSONAL CARE ITEMS) ×2 IMPLANT
PENCIL BUTTON HOLSTER BLD 10FT (ELECTRODE) ×2 IMPLANT
STAPLER VISISTAT 35W (STAPLE) IMPLANT
SUT CHROMIC 0 CT 802H (SUTURE) IMPLANT
SUT CHROMIC 0 CTX 36 (SUTURE) ×6 IMPLANT
SUT MON AB-0 CT1 36 (SUTURE) ×2 IMPLANT
SUT PDS AB 0 CTX 60 (SUTURE) ×2 IMPLANT
SUT PLAIN 0 NONE (SUTURE) IMPLANT
SUT VIC AB 4-0 KS 27 (SUTURE) ×2 IMPLANT
TOWEL OR 17X24 6PK STRL BLUE (TOWEL DISPOSABLE) ×2 IMPLANT
TRAY FOLEY CATH 14FR (SET/KITS/TRAYS/PACK) IMPLANT
WATER STERILE IRR 1000ML POUR (IV SOLUTION) ×2 IMPLANT

## 2013-01-18 NOTE — Anesthesia Preprocedure Evaluation (Deleted)
Anesthesia Evaluation  Patient identified by MRN, date of birth, ID band Patient awake    Reviewed: Allergy & Precautions, H&P , NPO status , Patient's Chart, lab work & pertinent test results  Airway Mallampati: II      Dental no notable dental hx.    Pulmonary neg pulmonary ROS,  breath sounds clear to auscultation  Pulmonary exam normal       Cardiovascular Exercise Tolerance: Good negative cardio ROS  Rhythm:regular Rate:Normal     Neuro/Psych negative neurological ROS  negative psych ROS   GI/Hepatic negative GI ROS, Neg liver ROS,   Endo/Other  negative endocrine ROS  Renal/GU negative Renal ROS  negative genitourinary   Musculoskeletal   Abdominal Normal abdominal exam  (+)   Peds  Hematology negative hematology ROS (+)   Anesthesia Other Findings   Reproductive/Obstetrics (+) Pregnancy                           Anesthesia Physical Anesthesia Plan  ASA: II and emergent  Anesthesia Plan: Spinal   Post-op Pain Management:    Induction:   Airway Management Planned:   Additional Equipment:   Intra-op Plan:   Post-operative Plan:   Informed Consent: I have reviewed the patients History and Physical, chart, labs and discussed the procedure including the risks, benefits and alternatives for the proposed anesthesia with the patient or authorized representative who has indicated his/her understanding and acceptance.     Plan Discussed with: Anesthesiologist, CRNA and Surgeon  Anesthesia Plan Comments:         Anesthesia Quick Evaluation                                   Anesthesia Evaluation  Patient identified by MRN, date of birth, ID band Patient awake    Reviewed: Allergy & Precautions, H&P , Patient's Chart, lab work & pertinent test results  Airway Mallampati: II TM Distance: >3 FB Neck ROM: full    Dental no notable dental hx.    Pulmonary neg  pulmonary ROS,  breath sounds clear to auscultation  Pulmonary exam normal       Cardiovascular negative cardio ROS  Rhythm:regular Rate:Normal     Neuro/Psych negative neurological ROS  negative psych ROS   GI/Hepatic negative GI ROS, Neg liver ROS,   Endo/Other  negative endocrine ROS  Renal/GU negative Renal ROS     Musculoskeletal   Abdominal   Peds  Hematology negative hematology ROS (+)   Anesthesia Other Findings   Reproductive/Obstetrics (+) Pregnancy                           Anesthesia Physical Anesthesia Plan  ASA: II  Anesthesia Plan: Epidural   Post-op Pain Management:    Induction:   Airway Management Planned:   Additional Equipment:   Intra-op Plan:   Post-operative Plan:   Informed Consent: I have reviewed the patients History and Physical, chart, labs and discussed the procedure including the risks, benefits and alternatives for the proposed anesthesia with the patient or authorized representative who has indicated his/her understanding and acceptance.     Plan Discussed with:   Anesthesia Plan Comments:         Anesthesia Quick Evaluation Evaluated and discussed epidural anesthesia with this patient.  She currently wants to have her birth naturally without an  epidural.  I agree with her preference as the risks of the epidural likely outweigh the benefits.  She has had multiple back surgeries for scoliosis with resultant permanent nerve damage.  I think an epidural is likely to provide less than ideal coverage and she would have a much higher risk of a PPDPH.  I would likely be willing to do a spinal for a CS.  Currently we are working to get the records for the levels of instrumentation of her spine, but the scar extends over most of her back.

## 2013-01-18 NOTE — Op Note (Signed)
Cesarean Section Procedure Note   Suzanne Flowers  01/18/2013  Indications: arrest of dilation   Pre-operative Diagnosis: arrest of dilatation.   Post-operative Diagnosis: Same   Surgeon: Surgeon(s) and Role:    * Zelphia Cairo, MD - Primary   Assistants: none  Anesthesia: spinal   Procedure Details:  The patient was seen in the Holding Room. The risks, benefits, complications, treatment options, and expected outcomes were discussed with the patient. The patient concurred with the proposed plan, giving informed consent. identified as Suzanne Flowers and the procedure verified as C-Section Delivery. A Time Out was held and the above information confirmed.  After induction of anesthesia, the patient was draped and prepped in the usual sterile manner. A transverse was made and carried down through the subcutaneous tissue to the fascia. Fascial incision was made and extended transversely. The fascia was separated from the underlying rectus tissue superiorly and inferiorly. The peritoneum was identified and entered. Peritoneal incision was extended longitudinally. The utero-vesical peritoneal reflection was incised transversely and the bladder flap was bluntly freed from the lower uterine segment. A low transverse uterine incision was made. Delivered from cephalic presentation was a vigerous female with Apgar scores of 9 at one minute and 9 at five minutes. Cord ph was not sent the umbilical cord was clamped and cut cord blood was obtained for evaluation. The placenta was removed Intact and appeared normal. The uterine outline, tubes and ovaries appeared normal}. The uterine incision was closed with running locked sutures of 0chromic gut.   Hemostasis was observed. Lavage was carried out until clear. Peritoneum was closed with 0 monocryl.  The fascia was then reapproximated with running sutures of 0PDS. The skin was closed with staples.   Instrument, sponge, and needle counts were correct  prior the abdominal closure and were correct at the conclusion of the case.    Findings:   Estimated Blood Loss: * No blood loss amount entered *   Urine Output: clear  Specimens: placenta for disposal  Complications: no complications  Disposition: PACU - hemodynamically stable.   Maternal Condition: stable   Baby condition / location:  with mom in OR  Attending Attestation: I was present and scrubbed for the entire procedure.   Signed: Surgeon(s): Zelphia Cairo, MD

## 2013-01-18 NOTE — OR Nursing (Signed)
Uterus massaged by S. Florine Sprenkle Charity fundraiser.  100 cc of blood evacuated from uterus during uterine massage. Two tubes of cord blood sent to lab.

## 2013-01-18 NOTE — Anesthesia Preprocedure Evaluation (Signed)
Anesthesia Evaluation  Patient identified by MRN, date of birth, ID band Patient awake    Reviewed: Allergy & Precautions, H&P , Patient's Chart, lab work & pertinent test results  Airway Mallampati: II TM Distance: >3 FB Neck ROM: full    Dental no notable dental hx.    Pulmonary neg pulmonary ROS,  breath sounds clear to auscultation  Pulmonary exam normal       Cardiovascular negative cardio ROS  Rhythm:regular Rate:Normal     Neuro/Psych negative neurological ROS  negative psych ROS   GI/Hepatic negative GI ROS, Neg liver ROS,   Endo/Other  negative endocrine ROS  Renal/GU negative Renal ROS     Musculoskeletal   Abdominal   Peds  Hematology negative hematology ROS (+)   Anesthesia Other Findings   Reproductive/Obstetrics (+) Pregnancy                           Anesthesia Physical Anesthesia Plan  ASA: II  Anesthesia Plan: Epidural   Post-op Pain Management:    Induction:   Airway Management Planned:   Additional Equipment:   Intra-op Plan:   Post-operative Plan:   Informed Consent: I have reviewed the patients History and Physical, chart, labs and discussed the procedure including the risks, benefits and alternatives for the proposed anesthesia with the patient or authorized representative who has indicated his/her understanding and acceptance.     Plan Discussed with:   Anesthesia Plan Comments:         Anesthesia Quick Evaluation Evaluated and discussed epidural anesthesia with this patient.  She currently wants to have her birth naturally without an epidural.  I agree with her preference as the risks of the epidural likely outweigh the benefits.  She has had multiple back surgeries for scoliosis with resultant permanent nerve damage.  I think an epidural is likely to provide less than ideal coverage and she would have a much higher risk of a PPDPH.  I would  likely be willing to do a spinal for a CS.  Currently we are working to get the records for the levels of instrumentation of her spine, but the scar extends over most of her back.

## 2013-01-18 NOTE — Anesthesia Postprocedure Evaluation (Signed)
  Anesthesia Post-op Note  Patient: Suzanne Flowers  Procedure(s) Performed: Procedure(s): Primary cesarean section with delivery of baby girl at 59. Apgars 9/9. (N/A)  Patient Location: PACU  Anesthesia Type:Spinal  Level of Consciousness: awake, alert  and oriented  Airway and Oxygen Therapy: Patient Spontanous Breathing  Post-op Pain: mild  Post-op Assessment: Post-op Vital signs reviewed, Patient's Cardiovascular Status Stable, Respiratory Function Stable, Patent Airway, No signs of Nausea or vomiting, Pain level controlled, No headache, No backache, No residual numbness and No residual motor weakness  Post-op Vital Signs: Reviewed and stable  Complications: No apparent anesthesia complications

## 2013-01-18 NOTE — Transfer of Care (Signed)
Immediate Anesthesia Transfer of Care Note  Patient: Suzanne Flowers  Procedure(s) Performed: Procedure(s): Primary cesarean section with delivery of baby girl at 22. Apgars 9/9. (N/A)  Patient Location: PACU  Anesthesia Type:Spinal  Level of Consciousness: awake, alert , oriented and patient cooperative  Airway & Oxygen Therapy: Patient Spontanous Breathing  Post-op Assessment: Report given to PACU RN and Post -op Vital signs reviewed and stable  Post vital signs: Reviewed and stable  Complications: No apparent anesthesia complications

## 2013-01-18 NOTE — Progress Notes (Signed)
Pt still with discomfort during ctx.  Some relief with IV pain meds  FHT reassuring Toco Q2-3 Cvx 8 cm/90/-1, no change  A/P:  Arrest of dilation No change in cervical exam despite adequate labor Primary c-section discussed with patient and family R/b/a reviewed, informed consent

## 2013-01-18 NOTE — H&P (Signed)
Suzanne Flowers is a 22 y.o. female presenting for SROM.  Also notes regular contractions.  No vb. History OB History   Grav Para Term Preterm Abortions TAB SAB Ect Mult Living   1              Past Medical History  Diagnosis Date  . Medical history non-contributory   . HSV infection    Past Surgical History  Procedure Laterality Date  . Back surgery     Family History: family history includes Diabetes in her maternal grandmother, paternal grandfather, and paternal grandmother. Social History:  reports that she has never smoked. She does not have any smokeless tobacco history on file. She reports that she does not drink alcohol or use illicit drugs.   Prenatal Transfer Tool  Maternal Diabetes: No Genetic Screening: Declined Maternal Ultrasounds/Referrals: Normal Fetal Ultrasounds or other Referrals:  None Maternal Substance Abuse:  No Significant Maternal Medications:  None Significant Maternal Lab Results:  None Other Comments:  None  ROS  Dilation: 7 Effacement (%): 80 Station: -1 Exam by:: [employer] Blood pressure 103/57, pulse 86, temperature 98.1 F (36.7 C), temperature source Oral, resp. rate 20, height 5' (1.524 m), weight 74.39 kg (164 lb), last menstrual period 04/22/2012. Exam Physical Exam  Gen - NAD Abd - gravid, NT Ext - NT  Prenatal labs: ABO, Rh: B/Positive/-- (03/18 0000) Antibody: Negative (03/18 0000) Rubella: Immune (03/18 0000) RPR: NON REACTIVE (08/18 2040)  HBsAg: Negative (03/18 0000)  HIV: Non-reactive (03/18 0000)  GBS: Negative (08/21 0000)   Assessment/Plan: Admit Exp mngt Anesthesia eval - do not rec epidural given h/o complicated back surgery.  Pt understands.  Can use IV pain meds prn   Sharell Hilmer 01/18/2013, 10:21 AM

## 2013-01-18 NOTE — Anesthesia Procedure Notes (Signed)
Spinal  Patient location during procedure: OR Start time: 01/18/2013 6:12 PM Staffing Anesthesiologist: Angus Seller., Harrell Gave. Performed by: anesthesiologist  Preanesthetic Checklist Completed: patient identified, site marked, surgical consent, pre-op evaluation, timeout performed, IV checked, risks and benefits discussed and monitors and equipment checked Spinal Block Patient position: sitting Prep: DuraPrep Patient monitoring: heart rate, cardiac monitor, continuous pulse ox and blood pressure Approach: midline Location: L4-5 Injection technique: single-shot Needle Needle type: Sprotte  Needle gauge: 24 G Needle length: 9 cm Assessment Sensory level: T4 Additional Notes Patient identified.  Risk benefits discussed including failed block, incomplete pain control, headache, nerve damage, paralysis, blood pressure changes, nausea, vomiting, reactions to medication both toxic or allergic, and postpartum back pain.  Patient expressed understanding and wished to proceed.  All questions were answered.  Sterile technique used throughout procedure.  CSF was clear.  No parasthesia or other complications.  Please see nursing notes for vital signs.

## 2013-01-18 NOTE — Progress Notes (Signed)
Report called to Dana RN in BS. Pt to BS from Triage for further eval of SROM.  

## 2013-01-18 NOTE — MAU Note (Signed)
Leaking fld since about 0400. Clear fld. Having contractons

## 2013-01-18 NOTE — Anesthesia Preprocedure Evaluation (Signed)
Anesthesia Evaluation  Patient identified by MRN, date of birth, ID band Patient awake    Reviewed: Allergy & Precautions, H&P , Patient's Chart, lab work & pertinent test results  Airway Mallampati: II TM Distance: >3 FB Neck ROM: full    Dental no notable dental hx.    Pulmonary neg pulmonary ROS,  breath sounds clear to auscultation  Pulmonary exam normal       Cardiovascular negative cardio ROS  Rhythm:regular Rate:Normal     Neuro/Psych negative neurological ROS  negative psych ROS   GI/Hepatic negative GI ROS, Neg liver ROS,   Endo/Other  negative endocrine ROS  Renal/GU negative Renal ROS     Musculoskeletal   Abdominal   Peds  Hematology negative hematology ROS (+)   Anesthesia Other Findings   Reproductive/Obstetrics (+) Pregnancy                           Anesthesia Physical  Anesthesia Plan  ASA: II and emergent  Anesthesia Plan: Spinal   Post-op Pain Management:    Induction:   Airway Management Planned:   Additional Equipment:   Intra-op Plan:   Post-operative Plan:   Informed Consent: I have reviewed the patients History and Physical, chart, labs and discussed the procedure including the risks, benefits and alternatives for the proposed anesthesia with the patient or authorized representative who has indicated his/her understanding and acceptance.     Plan Discussed with:   Anesthesia Plan Comments:         Anesthesia Quick Evaluation Evaluated and discussed epidural anesthesia with this patient.  She currently wants to have her birth naturally without an epidural.  I agree with her preference as the risks of the epidural likely outweigh the benefits.  She has had multiple back surgeries for scoliosis with resultant permanent nerve damage.  I think an epidural is likely to provide less than ideal coverage and she would have a much higher risk of a  PPDPH.  I would likely be willing to do a spinal for a CS.  Currently we are working to get the records for the levels of instrumentation of her spine, but the scar extends over most of her back.

## 2013-01-19 ENCOUNTER — Encounter (HOSPITAL_COMMUNITY): Payer: Self-pay | Admitting: Obstetrics and Gynecology

## 2013-01-19 LAB — CBC
MCH: 26.3 pg (ref 26.0–34.0)
Platelets: 140 10*3/uL — ABNORMAL LOW (ref 150–400)
RBC: 3.65 MIL/uL — ABNORMAL LOW (ref 3.87–5.11)

## 2013-01-19 NOTE — Progress Notes (Signed)
Subjective: Postpartum Day 1: Cesarean Delivery Patient reports tolerating PO.    Objective: Vital signs in last 24 hours: Temp:  [97.4 F (36.3 C)-99 F (37.2 C)] 98.7 F (37.1 C) (09/15 0645) Pulse Rate:  [69-96] 73 (09/15 0645) Resp:  [12-25] 16 (09/15 0645) BP: (97-197)/(47-163) 117/67 mmHg (09/15 0645) SpO2:  [96 %-100 %] 96 % (09/15 0645)  Physical Exam:  General: alert and cooperative Lochia: appropriate Uterine Fundus: firm Incision: honeycomb dressing CDI DVT Evaluation: No evidence of DVT seen on physical exam. Negative Homan's sign. No cords or calf tenderness. Calf/Ankle edema is present.   Recent Labs  01/18/13 0600 01/19/13 0555  HGB 12.7 9.6*  HCT 37.1 28.5*    Assessment/Plan: Status post Cesarean section. Doing well postoperatively.  Continue current care.  Idriss Quackenbush G 01/19/2013, 8:01 AM

## 2013-01-19 NOTE — Anesthesia Postprocedure Evaluation (Signed)
  Anesthesia Post-op Note  Patient: Suzanne Flowers  Procedure(s) Performed: * No procedures listed *  Patient Location: Mother/Baby  Anesthesia Type:Spinal  Level of Consciousness: awake  Airway and Oxygen Therapy: Patient Spontanous Breathing  Post-op Pain: none  Post-op Assessment: Patient's Cardiovascular Status Stable, Respiratory Function Stable, Patent Airway, No signs of Nausea or vomiting, Adequate PO intake, Pain level controlled, No headache, No backache, No residual numbness and No residual motor weakness  Post-op Vital Signs: Reviewed and stable  Complications: No apparent anesthesia complications

## 2013-01-20 MED ORDER — OXYCODONE-ACETAMINOPHEN 5-325 MG PO TABS: 1.0000 | ORAL_TABLET | ORAL | Status: DC | PRN

## 2013-01-20 MED ORDER — IBUPROFEN 600 MG PO TABS: 600.0000 mg | ORAL_TABLET | Freq: Four times a day (QID) | ORAL | Status: DC

## 2013-01-20 NOTE — Lactation Note (Signed)
This note was copied from the chart of Suzanne Flowers. Lactation Consultation Note  Patient Name: Suzanne Flowers [id number] Date: 01/20/2013 Reason for consult: Follow-up assessment;Difficult latch.  Mom has just finished feeding baby expressed milk by bottle and this is third feeding of 30 ml's of ebm.  Mom is using her DEBP and has decided to pump and bottle-feed.  Mom has [employer] and baby is covered by [employer].  She has not applied for [facility] for baby but may be eligible.  LC recommends she call BC/BS to determine options for pump purchase or rental and discuss plans with LC in am prior to discharge.  Mom will continue using DEBP here in hospital and feed baby expressed milk.  LC reviewed milk storage guidelines (Baby and Me, pg 16)   Maternal Data    Feeding Feeding Type: Breast Milk  LATCH Score/Interventions         Not attempting to latch now; pumping only             Lactation Tools Discussed/Used Tools: Pump Breast pump type: Double-Electric Breast Pump WIC Program: No (baby on medicaid, mom on BC/BS) Pump Review: Milk Storage Initiated by:: nursing staff initiated pump and mom obtaining up to 30 ml's at each pumping Date initiated:: 01/20/13   Consult Status Consult Status: Follow-up Date: 01/21/13 Follow-up type: In-patient    Warrick Parisian [facility] 01/20/2013, 9:53 PM

## 2013-01-20 NOTE — Progress Notes (Signed)
Baby staying as a pt, so mother discharge discontinued per Dr. Vincente Poli.

## 2013-01-20 NOTE — Discharge Summary (Signed)
Obstetric Discharge Summary Reason for Admission: rupture of membranes Prenatal Procedures: ultrasound Intrapartum Procedures: cesarean: low cervical, transverse Postpartum Procedures: none Complications-Operative and Postpartum: none Hemoglobin  Date Value Range Status  01/19/2013 9.6* 12.0 - 15.0 g/dL Final     REPEATED TO VERIFY     DELTA CHECK NOTED     HCT  Date Value Range Status  01/19/2013 28.5* 36.0 - 46.0 % Final    Physical Exam:  General: alert and cooperative Lochia: appropriate Uterine Fundus: firm Incision: honeycomb dressing CDI DVT Evaluation: No evidence of DVT seen on physical exam. Negative Homan's sign. No cords or calf tenderness. No significant calf/ankle edema.  Discharge Diagnoses: Term Pregnancy-delivered  Discharge Information: Date: 01/20/2013 Activity: pelvic rest Diet: routine Medications: PNV, Ibuprofen and Percocet Condition: stable Instructions: refer to practice specific booklet Discharge to: home   Newborn Data: Live born female  Birth Weight: 6 lb 13 oz (3090 g) APGAR: 9, 9  Home with mother.  Rudie Rikard G 01/20/2013, 8:05 AM

## 2013-01-21 LAB — COMPREHENSIVE METABOLIC PANEL
Alkaline Phosphatase: 87 U/L (ref 39–117)
BUN: 11 mg/dL (ref 6–23)
Calcium: 8.8 mg/dL (ref 8.4–10.5)
GFR calc Af Amer: >90 mL/min (ref >90)
Glucose, Bld: 78 mg/dL (ref 70–99)
Total Protein: 5.4 g/dL — ABNORMAL LOW (ref 6.0–8.3)

## 2013-01-21 LAB — CBC
HCT: 28.7 % — ABNORMAL LOW (ref 36.0–46.0)
Hemoglobin: 9.6 g/dL — ABNORMAL LOW (ref 12.0–15.0)
MCH: 26.4 pg (ref 26.0–34.0)
MCHC: 33.4 g/dL (ref 30.0–36.0)

## 2013-01-21 NOTE — Progress Notes (Signed)
Subjective: Postpartum Day 3: Cesarean Delivery Patient reports vomiting, tolerating PO, + flatus and no problems voiding.  Patient denies HA, blurred vision or RUQ pain. Baby feeding better. DC cancelled yesterday secondary to baby poor feeding. Patient report she did vomit yesterday. No further nausea  Objective: Vital signs in last 24 hours: Temp:  [98.8 F (37.1 C)-99.5 F (37.5 C)] 98.8 F (37.1 C) (09/17 0539) Pulse Rate:  [91] 91 (09/17 0539) Resp:  [17-18] 17 (09/17 0539) BP: (147-151)/(89-90) 151/89 mmHg (09/17 0539)  Physical Exam:  General: alert and cooperative Lochia: appropriate Uterine Fundus: firm Incision: honeycomb dressing CDI DVT Evaluation: No evidence of DVT seen on physical exam. Negative Homan's sign. No cords or calf tenderness. Calf/Ankle edema is present. DTR's 2+ 3+ edema   Recent Labs  01/19/13 0555  HGB 9.6*  HCT 28.5*    Assessment/Plan: Status post Cesarean section. Postoperative course complicated by slight increase in BP and pedal edema  Discharge home with standard precautions and return to clinic in 1 week for incision appointment and bp check.  PIH labs prior to discharge.  reviewed sign and symptoms of PIH.  Mathius Birkeland G 01/21/2013, 7:53 AM

## 2013-05-07 NOTE — L&D Delivery Note (Signed)
Delivery Note At 9:52 PM a viable female was delivered via  (Presentationvtx: ;  ).  APGAR:8/9 , ; weight .   Placenta status:intact , .3 vessel  Cord:  with the following complications:none .  Cord pH: na  Anesthesia: nione  Episiotomy: none Lacerations: none Suture Repair: na Est. Blood Loss (mL): 350  Mom to postpartum.  Baby to Couplet care / Skin to Skin.  Jedi Catalfamo S 01/21/2014, 10:09 PM

## 2013-07-13 ENCOUNTER — Inpatient Hospital Stay (HOSPITAL_COMMUNITY)
Admission: AD | Admit: 2013-07-13 | Discharge: 2013-07-13 | Disposition: A | Discharge status: Home / Self Care | Payer: Medicaid Other | Source: Ambulatory Visit | Attending: Obstetrics & Gynecology | Admitting: Obstetrics & Gynecology

## 2013-07-13 ENCOUNTER — Encounter (HOSPITAL_COMMUNITY): Payer: Self-pay | Admitting: *Deleted

## 2013-07-13 DIAGNOSIS — Z3201 Encounter for pregnancy test, result positive: Secondary | ICD-10-CM | POA: Insufficient documentation

## 2013-07-13 DIAGNOSIS — E860 Dehydration: Secondary | ICD-10-CM | POA: Insufficient documentation

## 2013-07-13 DIAGNOSIS — IMO0001 Reserved for inherently not codable concepts without codable children: Secondary | ICD-10-CM

## 2013-07-13 LAB — URINALYSIS, ROUTINE W REFLEX MICROSCOPIC
Bilirubin Urine: NEGATIVE
Glucose, UA: NEGATIVE mg/dL
HGB URINE DIPSTICK: NEGATIVE
Ketones, ur: 15 mg/dL — AB
Leukocytes, UA: NEGATIVE
NITRITE: NEGATIVE
PROTEIN: NEGATIVE mg/dL
Specific Gravity, Urine: >1.03 — ABNORMAL HIGH (ref 1.005–1.030)
UROBILINOGEN UA: 0.2 mg/dL (ref 0.0–1.0)
pH: 6 (ref 5.0–8.0)

## 2013-07-13 LAB — POCT PREGNANCY, URINE: PREG TEST UR: POSITIVE — AB

## 2013-07-13 NOTE — MAU Note (Signed)
Foul smelling urine, denies dysuria or frequency.  Denies pain or bleeding.  Pos HPT one month ago.

## 2013-07-13 NOTE — MAU Note (Signed)
Pt states she has had a couple of episodes of spotting since her C/S six months ago, but has not had a period.

## 2013-07-13 NOTE — MAU Provider Note (Signed)
Chief Complaint: foul smelling urine  and possible pregnant    First Provider Initiated Contact with Patient 07/13/13 1913     SUBJECTIVE HPI: Suzanne Flowers is a 23 y.o. G2P1001 at Unknown by LMP who presents to maternity admissions reporting foul smelling urine today.  She denies abdominal pain or vaginal bleeding.  She reports she had some spotting 2 months ago following her delivery 6 months ago but no normal menses so she is not sure how far along she is.  She reports she has not had anything to eat or drink all day.  She denies vaginal itching/burning, urinary symptoms, h/a, dizziness, n/v, or fever/chills.    She was initially unsure about keeping this pregnancy since it is unexpected and soon after her previous pregnancy but she does now plan to keep the baby.  She will start prenatal care at [facility] as soon as possible.     Past Medical History  Diagnosis Date  . Medical history non-contributory   . HSV infection    Past Surgical History  Procedure Laterality Date  . Back surgery    . Cesarean section N/A 01/18/2013    Procedure: Primary cesarean section with delivery of baby girl at 1826. Apgars 9/9.;  Surgeon: Zelphia CairoGretchen Adkins, MD;  Location: [facility];  Service: Obstetrics;  Laterality: N/A;   History   Social History  . Marital Status: Single    Spouse Name: N/A    Number of Children: N/A  . Years of Education: N/A   Occupational History  . Not on file.   Social History Main Topics  . Smoking status: Never Smoker   . Smokeless tobacco: Not on file  . Alcohol Use: No  . Drug Use: No  . Sexual Activity: Yes    Birth Control/ Protection: None     Comment: last sex 2 months ago   Other Topics Concern  . Not on file   Social History Narrative  . No narrative on file   No current facility-administered medications on file prior to encounter.   No current outpatient prescriptions on file prior to encounter.   No Known Allergies  ROS: Pertinent  items in HPI  OBJECTIVE Blood pressure 114/79, pulse 95, temperature 98.9 F (37.2 C), temperature source Oral, resp. rate 18, height 5' (1.524 m), weight 54.704 kg (120 lb 9.6 oz), last menstrual period 05/15/2013, unknown if currently breastfeeding. GENERAL: Well-developed, well-nourished female in no acute distress.  HEENT: Normocephalic HEART: normal rate RESP: normal effort ABDOMEN: Soft, non-tender EXTREMITIES: Nontender, no edema NEURO: Alert and oriented SPECULUM EXAM: Declined by pt  LAB RESULTS Results for orders placed during the hospital encounter of 07/13/13 (from the past 24 hour(s))  URINALYSIS, ROUTINE W REFLEX MICROSCOPIC     Status: Abnormal   Collection Time    07/13/13  6:05 PM      Result Value Ref Range   Color, Urine YELLOW  YELLOW   APPearance HAZY (*) CLEAR   Specific Gravity, Urine >1.030 (*) 1.005 - 1.030   pH 6.0  5.0 - 8.0   Glucose, UA NEGATIVE  NEGATIVE mg/dL   Hgb urine dipstick NEGATIVE  NEGATIVE   Bilirubin Urine NEGATIVE  NEGATIVE   Ketones, ur 15 (*) NEGATIVE mg/dL   Protein, ur NEGATIVE  NEGATIVE mg/dL   Urobilinogen, UA 0.2  0.0 - 1.0 mg/dL   Nitrite NEGATIVE  NEGATIVE   Leukocytes, UA NEGATIVE  NEGATIVE  POCT PREGNANCY, URINE     Status: Abnormal  Collection Time    07/13/13  6:28 PM      Result Value Ref Range   Preg Test, Ur POSITIVE (*) NEGATIVE     ASSESSMENT 1. Mild dehydration   2. Positive pregnancy test   3. Gestational age unknown     PLAN Discharge home Increase PO fluids Urine sent for culture Outpatient viability ultrasound ordered Schedule early prenatal visit with provider Return to MAU as needed    Medication List    Notice   You have not been prescribed any medications.         Follow-up Information   Follow up with [facility]. (Ultrasound will call you with an appointment or call the number listed below. Return to MAU with any problems.  )    Specialty:   Radiology   Contact information:   [address] [id number] [place] [address] [phone]      Sharen Counter Certified Nurse-Midwife 07/13/2013  7:25 PM

## 2013-07-13 NOTE — Discharge Instructions (Signed)
Dehydration, Adult Dehydration is when you lose more fluids from the body than you take in. Vital organs like the kidneys, brain, and heart cannot function without a proper amount of fluids and salt. Any loss of fluids from the body can cause dehydration.  CAUSES   Vomiting.  Diarrhea.  Excessive sweating.  Excessive urine output.  Fever. SYMPTOMS  Mild dehydration  Thirst.  Dry lips.  Slightly dry mouth. Moderate dehydration  Very dry mouth.  Sunken eyes.  Skin does not bounce back quickly when lightly pinched and released.  Dark urine and decreased urine production.  Decreased tear production.  Headache. Severe dehydration  Very dry mouth.  Extreme thirst.  Rapid, weak pulse (more than 100 beats per minute at rest).  Cold hands and feet.  Not able to sweat in spite of heat and temperature.  Rapid breathing.  Blue lips.  Confusion and lethargy.  Difficulty being awakened.  Minimal urine production.  No tears. DIAGNOSIS  Your caregiver will diagnose dehydration based on your symptoms and your exam. Blood and urine tests will help confirm the diagnosis. The diagnostic evaluation should also identify the cause of dehydration. TREATMENT  Treatment of mild or moderate dehydration can often be done at home by increasing the amount of fluids that you drink. It is best to drink small amounts of fluid more often. Drinking too much at one time can make vomiting worse. Refer to the home care instructions below. Severe dehydration needs to be treated at the hospital where you will probably be given intravenous (IV) fluids that contain water and electrolytes. HOME CARE INSTRUCTIONS   Ask your caregiver about specific rehydration instructions.  Drink enough fluids to keep your urine clear or pale yellow.  Drink small amounts frequently if you have nausea and vomiting.  Eat as you normally do.  Avoid:  Foods or drinks high in sugar.  Carbonated  drinks.  Juice.  Extremely hot or cold fluids.  Drinks with caffeine.  Fatty, greasy foods.  Alcohol.  Tobacco.  Overeating.  Gelatin desserts.  Wash your hands well to avoid spreading bacteria and viruses.  Only take over-the-counter or prescription medicines for pain, discomfort, or fever as directed by your caregiver.  Ask your caregiver if you should continue all prescribed and over-the-counter medicines.  Keep all follow-up appointments with your caregiver. SEEK MEDICAL CARE IF:  You have abdominal pain and it increases or stays in one area (localizes).  You have a rash, stiff neck, or severe headache.  You are irritable, sleepy, or difficult to awaken.  You are weak, dizzy, or extremely thirsty. SEEK IMMEDIATE MEDICAL CARE IF:   You are unable to keep fluids down or you get worse despite treatment.  You have frequent episodes of vomiting or diarrhea.  You have blood or green matter (bile) in your vomit.  You have blood in your stool or your stool looks black and tarry.  You have not urinated in 6 to 8 hours, or you have only urinated a small amount of very dark urine.  You have a fever.  You faint. MAKE SURE YOU:   Understand these instructions.  Will watch your condition.  Will get help right away if you are not doing well or get worse. Document Released: 04/23/2005 Document Revised: 07/16/2011 Document Reviewed: 12/11/2010 [facility] Patient Information 2014 [place], [place].  Pregnancy - First Trimester During sexual intercourse, millions of sperm go into the vagina. Only 1 sperm will penetrate and fertilize the female egg while it is  in the Fallopian tube. One week later, the fertilized egg implants into the wall of the uterus. An embryo begins to develop into a baby. At 6 to 8 weeks, the eyes and face are formed and the heartbeat can be seen on ultrasound. At the end of 12 weeks (first trimester), all the baby's organs are formed. Now that you are  pregnant, you will want to do everything you can to have a healthy baby. Two of the most important things are to get good prenatal care and follow your caregiver's instructions. Prenatal care is all the medical care you receive before the baby's birth. It is given to prevent, find, and treat problems during the pregnancy and childbirth. PRENATAL EXAMS  During prenatal visits, your weight, blood pressure, and urine are checked. This is done to make sure you are healthy and progressing normally during the pregnancy.  A pregnant woman should gain 25 to 35 pounds during the pregnancy. However, if you are overweight or underweight, your caregiver will advise you regarding your weight.  Your caregiver will ask and answer questions for you.  Blood work, cervical cultures, other necessary tests, and a Pap test are done during your prenatal exams. These tests are done to check on your health and the probable health of your baby. Tests are strongly recommended and done for HIV with your permission. This is the virus that causes AIDS. These tests are done because medicines can be given to help prevent your baby from being born with this infection should you have been infected without knowing it. Blood work is also used to find out your blood type, previous infections, and follow your blood levels (hemoglobin).  Low hemoglobin (anemia) is common during pregnancy. Iron and vitamins are given to help prevent this. Later in the pregnancy, blood tests for diabetes will be done along with any other tests if any problems develop.  You may need other tests to make sure you and the baby are doing well. CHANGES DURING THE FIRST TRIMESTER  Your body goes through many changes during pregnancy. They vary from person to person. Talk to your caregiver about changes you notice and are concerned about. Changes can include:  Your menstrual period stops.  The egg and sperm carry the genes that determine what you look like.  Genes from you and your partner are forming a baby. The female genes determine whether the baby is a boy or a girl.  Your body increases in girth and you may feel bloated.  Feeling sick to your stomach (nauseous) and throwing up (vomiting). If the vomiting is uncontrollable, call your caregiver.  Your breasts will begin to enlarge and become tender.  Your nipples may stick out more and become darker.  The need to urinate more. Painful urination may mean you have a bladder infection.  Tiring easily.  Loss of appetite.  Cravings for certain kinds of food.  At first, you may gain or lose a couple of pounds.  You may have changes in your emotions from day to day (excited to be pregnant or concerned something may go wrong with the pregnancy and baby).  You may have more vivid and strange dreams. HOME CARE INSTRUCTIONS   It is very important to avoid all smoking, alcohol and non-prescribed drugs during your pregnancy. These affect the formation and growth of the baby. Avoid chemicals while pregnant to ensure the delivery of a healthy infant.  Start your prenatal visits by the 12th week of pregnancy. They are usually  scheduled monthly at first, then more often in the last 2 months before delivery. Keep your caregiver's appointments. Follow your caregiver's instructions regarding medicine use, blood and lab tests, exercise, and diet.  During pregnancy, you are providing food for you and your baby. Eat regular, well-balanced meals. Choose foods such as meat, fish, milk and other low fat dairy products, vegetables, fruits, and whole-grain breads and cereals. Your caregiver will tell you of the ideal weight gain.  You can help morning sickness by keeping soda crackers at the bedside. Eat a couple before arising in the morning. You may want to use the crackers without salt on them.  Eating 4 to 5 small meals rather than 3 large meals a day also may help the nausea and vomiting.  Drinking liquids  between meals instead of during meals also seems to help nausea and vomiting.  A physical sexual relationship may be continued throughout pregnancy if there are no other problems. Problems may be early (premature) leaking of amniotic fluid from the membranes, vaginal bleeding, or belly (abdominal) pain.  Exercise regularly if there are no restrictions. Check with your caregiver or physical therapist if you are unsure of the safety of some of your exercises. Greater weight gain will occur in the last 2 trimesters of pregnancy. Exercising will help:  Control your weight.  Keep you in shape.  Prepare you for labor and delivery.  Help you lose your pregnancy weight after you deliver your baby.  Wear a good support or jogging bra for breast tenderness during pregnancy. This may help if worn during sleep too.  Ask when prenatal classes are available. Begin classes when they are offered.  Do not use hot tubs, steam rooms, or saunas.  Wear your seat belt when driving. This protects you and your baby if you are in an accident.  Avoid raw meat, uncooked cheese, cat litter boxes, and soil used by cats throughout the pregnancy. These carry germs that can cause birth defects in the baby.  The first trimester is a good time to visit your dentist for your dental health. Getting your teeth cleaned is okay. Use a softer toothbrush and brush gently during pregnancy.  Ask for help if you have financial, counseling, or nutritional needs during pregnancy. Your caregiver will be able to offer counseling for these needs as well as refer you for other special needs.  Do not take any medicines or herbs unless told by your caregiver.  Inform your caregiver if there is any mental or physical domestic violence.  Make a list of emergency phone numbers of family, friends, hospital, and police and fire departments.  Write down your questions. Take them to your prenatal visit.  Do not douche.  Do not cross  your legs.  If you have to stand for long periods of time, rotate you feet or take small steps in a circle.  You may have more vaginal secretions that may require a sanitary pad. Do not use tampons or scented sanitary pads. MEDICINES AND DRUG USE IN PREGNANCY  Take prenatal vitamins as directed. The vitamin should contain 1 milligram of folic acid. Keep all vitamins out of reach of children. Only a couple vitamins or tablets containing iron may be fatal to a baby or young child when ingested.  Avoid use of all medicines, including herbs, over-the-counter medicines, not prescribed or suggested by your caregiver. Only take over-the-counter or prescription medicines for pain, discomfort, or fever as directed by your caregiver. Do not use aspirin,  ibuprofen, or naproxen unless directed by your caregiver.  Let your caregiver also know about herbs you may be using.  Alcohol is related to a number of birth defects. This includes fetal alcohol syndrome. All alcohol, in any form, should be avoided completely. Smoking will cause low birth rate and premature babies.  Street or illegal drugs are very harmful to the baby. They are absolutely forbidden. A baby born to an addicted mother will be addicted at birth. The baby will go through the same withdrawal an adult does.  Let your caregiver know about any medicines that you have to take and for what reason you take them. SEEK MEDICAL CARE IF:  You have any concerns or worries during your pregnancy. It is better to call with your questions if you feel they cannot wait, rather than worry about them. SEEK IMMEDIATE MEDICAL CARE IF:   An unexplained oral temperature above 102 F (38.9 C) develops, or as your caregiver suggests.  You have leaking of fluid from the vagina (birth canal). If leaking membranes are suspected, take your temperature and inform your caregiver of this when you call.  There is vaginal spotting or bleeding. Notify your caregiver of  the amount and how many pads are used.  You develop a bad smelling vaginal discharge with a change in the color.  You continue to feel sick to your stomach (nauseated) and have no relief from remedies suggested. You vomit blood or coffee ground-like materials.  You lose more than 2 pounds of weight in 1 week.  You gain more than 2 pounds of weight in 1 week and you notice swelling of your face, hands, feet, or legs.  You gain 5 pounds or more in 1 week (even if you do not have swelling of your hands, face, legs, or feet).  You get exposed to [place] measles and have never had them.  You are exposed to fifth disease or chickenpox.  You develop belly (abdominal) pain. Round ligament discomfort is a common non-cancerous (benign) cause of abdominal pain in pregnancy. Your caregiver still must evaluate this.  You develop headache, fever, diarrhea, pain with urination, or shortness of breath.  You fall or are in a car accident or have any kind of trauma.  There is mental or physical violence in your home. Document Released: 04/17/2001 Document Revised: 01/16/2012 Document Reviewed: 10/19/2008 [facility] Patient Information 2014 [place], [place].

## 2013-07-16 LAB — URINE CULTURE: Colony Count: >=100000

## 2013-07-17 ENCOUNTER — Telehealth: Payer: Self-pay | Admitting: Obstetrics and Gynecology

## 2013-07-17 MED ORDER — NITROFURANTOIN MONOHYD MACRO 100 MG PO CAPS: 100.0000 mg | ORAL_CAPSULE | Freq: Two times a day (BID) | ORAL | Status: DC

## 2013-07-17 NOTE — Telephone Encounter (Signed)
informed patient of + UTI medication sent to her pharmacy.

## 2013-07-21 ENCOUNTER — Ambulatory Visit (HOSPITAL_COMMUNITY)
Admission: RE | Admit: 2013-07-21 | Discharge: 2013-07-21 | Disposition: A | Discharge status: Home / Self Care | Payer: BC Managed Care – PPO | Source: Ambulatory Visit | Attending: Advanced Practice Midwife | Admitting: Advanced Practice Midwife

## 2013-07-21 DIAGNOSIS — O34599 Maternal care for other abnormalities of gravid uterus, unspecified trimester: Secondary | ICD-10-CM | POA: Insufficient documentation

## 2013-07-21 DIAGNOSIS — Z3201 Encounter for pregnancy test, result positive: Secondary | ICD-10-CM

## 2013-07-21 DIAGNOSIS — N831 Corpus luteum cyst of ovary, unspecified side: Secondary | ICD-10-CM | POA: Insufficient documentation

## 2013-07-21 DIAGNOSIS — Z3689 Encounter for other specified antenatal screening: Secondary | ICD-10-CM | POA: Insufficient documentation

## 2013-07-21 DIAGNOSIS — O208 Other hemorrhage in early pregnancy: Secondary | ICD-10-CM | POA: Insufficient documentation

## 2013-07-21 DIAGNOSIS — E860 Dehydration: Secondary | ICD-10-CM

## 2013-07-26 ENCOUNTER — Encounter (HOSPITAL_COMMUNITY): Payer: Self-pay | Admitting: *Deleted

## 2013-07-26 ENCOUNTER — Inpatient Hospital Stay (HOSPITAL_COMMUNITY)
Admission: AD | Admit: 2013-07-26 | Discharge: 2013-07-26 | Disposition: A | Discharge status: Home / Self Care | Payer: BC Managed Care – PPO | Source: Ambulatory Visit | Attending: Obstetrics and Gynecology | Admitting: Obstetrics and Gynecology

## 2013-07-26 DIAGNOSIS — O210 Mild hyperemesis gravidarum: Secondary | ICD-10-CM | POA: Insufficient documentation

## 2013-07-26 DIAGNOSIS — Z833 Family history of diabetes mellitus: Secondary | ICD-10-CM | POA: Insufficient documentation

## 2013-07-26 DIAGNOSIS — O219 Vomiting of pregnancy, unspecified: Secondary | ICD-10-CM

## 2013-07-26 LAB — URINE MICROSCOPIC-ADD ON

## 2013-07-26 LAB — URINALYSIS, ROUTINE W REFLEX MICROSCOPIC
Bilirubin Urine: NEGATIVE
Glucose, UA: NEGATIVE mg/dL
Ketones, ur: 15 mg/dL — AB
NITRITE: NEGATIVE
PH: 6 (ref 5.0–8.0)
PROTEIN: NEGATIVE mg/dL
Specific Gravity, Urine: 1.02 (ref 1.005–1.030)
Urobilinogen, UA: 1 mg/dL (ref 0.0–1.0)

## 2013-07-26 MED ORDER — PROMETHAZINE HCL 12.5 MG PO TABS: 12.5000 mg | ORAL_TABLET | Freq: Four times a day (QID) | ORAL | Status: DC | PRN

## 2013-07-26 MED ORDER — PROMETHAZINE HCL 25 MG/ML IJ SOLN
25.0000 mg | Freq: Once | INTRAMUSCULAR | Status: AC
  Administered 2013-07-26: 25 mg via INTRAMUSCULAR
  Filled 2013-07-26: qty 1

## 2013-07-26 NOTE — Discharge Instructions (Signed)
Morning Sickness °Morning sickness is when you feel sick to your stomach (nauseous) during pregnancy. This nauseous feeling may or may not come with vomiting. It often occurs in the morning but can be a problem any time of day. Morning sickness is most common during the first trimester, but it may continue throughout pregnancy. While morning sickness is unpleasant, it is usually harmless unless you develop severe and continual vomiting (hyperemesis gravidarum). This condition requires more intense treatment.  °CAUSES  °The cause of morning sickness is not completely known but seems to be related to normal hormonal changes that occur in pregnancy. °RISK FACTORS °You are at greater risk if you: °· Experienced nausea or vomiting before your pregnancy. °· Had morning sickness during a previous pregnancy. °· Are pregnant with more than one baby, such as twins. °TREATMENT  °Do not use any medicines (prescription, over-the-counter, or herbal) for morning sickness without first talking to your health care provider. Your health care provider may prescribe or recommend: °· Vitamin B6 supplements. °· Anti-nausea medicines. °· The herbal medicine ginger. °HOME CARE INSTRUCTIONS  °· Only take over-the-counter or prescription medicines as directed by your health care provider. °· Taking multivitamins before getting pregnant can prevent or decrease the severity of morning sickness in most women.   °· Eat a piece of dry toast or unsalted crackers before getting out of bed in the morning.   °· Eat five or six small meals a day.   °· Eat dry and bland foods (rice, baked potato ). Foods high in carbohydrates are often helpful.  °· Do not drink liquids with your meals. Drink liquids between meals.   °· Avoid greasy, fatty, and spicy foods.   °· Get someone to cook for you if the smell of any food causes nausea and vomiting.   °· If you feel nauseous after taking prenatal vitamins, take the vitamins at night or with a snack.  °· Snack  on protein foods (nuts, yogurt, cheese) between meals if you are hungry.   °· Eat unsweetened gelatins for desserts.   °· Wearing an acupressure wristband (worn for sea sickness) may be helpful.   °· Acupuncture may be helpful.   °· Do not smoke.   °· Get a humidifier to keep the air in your house free of odors.   °· Get plenty of fresh air. °SEEK MEDICAL CARE IF:  °· Your home remedies are not working, and you need medicine. °· You feel dizzy or lightheaded. °· You are losing weight. °SEEK IMMEDIATE MEDICAL CARE IF:  °· You have persistent and uncontrolled nausea and vomiting. °· You pass out (faint). °Document Released: 06/14/2006 Document Revised: 12/24/2012 Document Reviewed: 10/08/2012 °[employer]® Patient Information ©2014 [employer]. ° °

## 2013-07-26 NOTE — MAU Provider Note (Signed)
History     CSN: [id number]  Arrival date and time: 07/26/13 [id number]   First Provider Initiated Contact with Patient 07/26/13 2031      Chief Complaint  Patient presents with  . Emesis During Pregnancy   HPI  Suzanne Flowers is a 23 y.o. G3P1001 at 5015w2d who presents today with nausea and vomiting. She states that for the last four days she has been unable to keep anything down, and she vomits every time she eats or drinks. She has not been given any antiemetics for this pregnancy. She has not started prenatal care at this time. She has also had diarrhea, but denies any fever. She denies any close contacts who have been sick. She states that she has not vomited since she has been here in MAU.   Past Medical History  Diagnosis Date  . Medical history non-contributory   . HSV infection     Past Surgical History  Procedure Laterality Date  . Back surgery    . Cesarean section N/A 01/18/2013    Procedure: Primary cesarean section with delivery of baby girl at 1826. Apgars 9/9.;  Surgeon: Zelphia CairoGretchen Adkins, MD;  Location: [facility];  Service: Obstetrics;  Laterality: N/A;    Family History  Problem Relation Age of Onset  . Diabetes Maternal Grandmother   . Diabetes Paternal Grandmother   . Diabetes Paternal Grandfather     History  Substance Use Topics  . Smoking status: Never Smoker   . Smokeless tobacco: Not on file  . Alcohol Use: No    Allergies: No Known Allergies  Prescriptions prior to admission  Medication Sig Dispense Refill  . nitrofurantoin, macrocrystal-monohydrate, (MACROBID) 100 MG capsule Take 1 capsule (100 mg total) by mouth 2 (two) times daily.  14 capsule  0    ROS Physical Exam   Blood pressure 129/73, pulse 87, temperature 98.9 F (37.2 C), temperature source Oral, resp. rate 16, height 5' (1.524 m), weight 52.164 kg (115 lb), last menstrual period 05/15/2013, not currently breastfeeding.  Physical Exam  Nursing note and vitals  reviewed. Constitutional: She is oriented to person, place, and time. She appears well-developed and well-nourished. No distress.  Cardiovascular: Normal rate.   Respiratory: Effort normal.  GI: Soft. There is no tenderness.  Genitourinary:  FHT with doppler 155  Neurological: She is alert and oriented to person, place, and time.  Skin: Skin is warm and dry.  Psychiatric: She has a normal mood and affect.    MAU Course  Procedures Results for orders placed during the hospital encounter of 07/26/13 (from the past 48 hour(s))  URINALYSIS, ROUTINE W REFLEX MICROSCOPIC     Status: Abnormal   Collection Time    07/26/13  7:55 PM      Result Value Ref Range   Color, Urine YELLOW  YELLOW   APPearance CLEAR  CLEAR   Specific Gravity, Urine 1.020  1.005 - 1.030   pH 6.0  5.0 - 8.0   Glucose, UA NEGATIVE  NEGATIVE mg/dL   Hgb urine dipstick TRACE (*) NEGATIVE   Bilirubin Urine NEGATIVE  NEGATIVE   Ketones, ur 15 (*) NEGATIVE mg/dL   Protein, ur NEGATIVE  NEGATIVE mg/dL   Urobilinogen, UA 1.0  0.0 - 1.0 mg/dL   Nitrite NEGATIVE  NEGATIVE   Leukocytes, UA TRACE (*) NEGATIVE  URINE MICROSCOPIC-ADD ON     Status: Abnormal   Collection Time    07/26/13  7:55 PM      Result Value Ref  Range   Squamous Epithelial / LPF MANY (*) RARE   WBC, UA 3-6  <3 WBC/hpf   RBC / HPF 3-6  <3 RBC/hpf   Bacteria, UA FEW (*) RARE   Urine-Other MUCOUS PRESENT     2203: Patient has been able to keep down PO food and fluids since having phenergan.   Assessment and Plan   1. Nausea/vomiting in pregnancy    Return precautions reviewed    Medication List         nitrofurantoin (macrocrystal-monohydrate) 100 MG capsule  Commonly known as:  MACROBID  Take 1 capsule (100 mg total) by mouth 2 (two) times daily.     promethazine 12.5 MG tablet  Commonly known as:  PHENERGAN  Take 1 tablet (12.5 mg total) by mouth every 6 (six) hours as needed for nausea or vomiting.       Follow-up Information    Schedule an appointment as soon as possible for a visit with [facility] [place].   Contact information:   [address] [place] [address] [phone]       Tawnya Crook 07/26/2013, 8:33 PM

## 2013-07-26 NOTE — MAU Note (Signed)
Pt G2 P1 at  13.2wks with N/V/D since 3/18, unable to keep anything down.

## 2013-07-26 NOTE — MAU Provider Note (Signed)
`````  Attestation of Attending Supervision of Advanced Practitioner: Evaluation and management procedures were performed by the PA/NP/CNM/OB Fellow under my supervision/collaboration. Chart reviewed and agree with management and plan.  Greycen Felter V 07/26/2013 11:12 PM

## 2013-07-30 LAB — OB RESULTS CONSOLE GC/CHLAMYDIA
CHLAMYDIA, DNA PROBE: NEGATIVE
Gonorrhea: NEGATIVE

## 2013-07-30 LAB — OB RESULTS CONSOLE RPR: RPR: NONREACTIVE

## 2013-07-30 LAB — OB RESULTS CONSOLE ANTIBODY SCREEN: Antibody Screen: NEGATIVE

## 2013-07-30 LAB — OB RESULTS CONSOLE ABO/RH: RH Type: POSITIVE

## 2013-07-30 LAB — OB RESULTS CONSOLE RUBELLA ANTIBODY, IGM: RUBELLA: IMMUNE

## 2013-07-30 LAB — OB RESULTS CONSOLE HIV ANTIBODY (ROUTINE TESTING): HIV: NONREACTIVE

## 2013-07-30 LAB — OB RESULTS CONSOLE HEPATITIS B SURFACE ANTIGEN: HEP B S AG: NEGATIVE

## 2014-01-06 LAB — OB RESULTS CONSOLE GBS: GBS: NEGATIVE

## 2014-01-19 ENCOUNTER — Encounter (HOSPITAL_COMMUNITY): Payer: Self-pay | Admitting: General Practice

## 2014-01-19 ENCOUNTER — Inpatient Hospital Stay (HOSPITAL_COMMUNITY)
Admission: AD | Admit: 2014-01-19 | Discharge: 2014-01-19 | Disposition: A | Discharge status: Home / Self Care | Payer: Medicaid Other | Source: Ambulatory Visit | Attending: Obstetrics and Gynecology | Admitting: Obstetrics and Gynecology

## 2014-01-19 DIAGNOSIS — A499 Bacterial infection, unspecified: Secondary | ICD-10-CM | POA: Insufficient documentation

## 2014-01-19 DIAGNOSIS — O4690 Antepartum hemorrhage, unspecified, unspecified trimester: Secondary | ICD-10-CM | POA: Insufficient documentation

## 2014-01-19 DIAGNOSIS — B9689 Other specified bacterial agents as the cause of diseases classified elsewhere: Secondary | ICD-10-CM | POA: Diagnosis not present

## 2014-01-19 DIAGNOSIS — O368190 Decreased fetal movements, unspecified trimester, not applicable or unspecified: Secondary | ICD-10-CM | POA: Diagnosis not present

## 2014-01-19 DIAGNOSIS — O2390 Unspecified genitourinary tract infection in pregnancy, unspecified trimester: Secondary | ICD-10-CM | POA: Insufficient documentation

## 2014-01-19 DIAGNOSIS — N760 Acute vaginitis: Secondary | ICD-10-CM | POA: Diagnosis not present

## 2014-01-19 LAB — WET PREP, GENITAL
Trich, Wet Prep: NONE SEEN
Yeast Wet Prep HPF POC: NONE SEEN

## 2014-01-19 MED ORDER — METRONIDAZOLE 500 MG PO TABS: 500.0000 mg | ORAL_TABLET | Freq: Two times a day (BID) | ORAL | Status: DC

## 2014-01-19 NOTE — Progress Notes (Signed)
Wet prep sent.   

## 2014-01-19 NOTE — MAU Provider Note (Signed)
  History     CSN: [id number]  Arrival date and time: 01/19/14 1357   First Provider Initiated Contact with Patient 01/19/14 1444      Chief Complaint  Patient presents with  . Decreased Fetal Movement  . Vaginal Bleeding   Vaginal Bleeding Pertinent negatives include no abdominal pain, dysuria, frequency, hematuria or urgency.    Pt is a 23 yo G2P1001 at [redacted]w[redacted]d wks IUP here with report of vaginal bleeding for the past 2 days. Reports bleeding with wiping, does not require wearing a pad.  Reports feeling decreased fetal movement x 2 days.  No report of abdominal pain.      Past Medical History  Diagnosis Date  . Medical history non-contributory   . HSV infection     Past Surgical History  Procedure Laterality Date  . Back surgery    . Cesarean section N/A 01/18/2013    Procedure: Primary cesarean section with delivery of baby girl at 1826. Apgars 9/9.;  Surgeon: Zelphia Cairo, MD;  Location: [facility];  Service: Obstetrics;  Laterality: N/A;    Family History  Problem Relation Age of Onset  . Diabetes Maternal Grandmother   . Diabetes Paternal Grandmother   . Diabetes Paternal Grandfather     History  Substance Use Topics  . Smoking status: Never Smoker   . Smokeless tobacco: Not on file  . Alcohol Use: No    Allergies: No Known Allergies  No prescriptions prior to admission    Review of Systems  Gastrointestinal: Negative for abdominal pain.  Genitourinary: Positive for vaginal bleeding. Negative for dysuria, urgency, frequency and hematuria.       Vaginal bleeding  All other systems reviewed and are negative.  Physical Exam   Blood pressure 112/68, pulse 100, temperature 98.7 F (37.1 C), temperature source Oral, resp. rate 16, last menstrual period 05/15/2013, not currently breastfeeding.  Physical Exam  Constitutional: She is oriented to person, place, and time. She appears well-developed and well-nourished. No distress.  HENT:  Head: Normocephalic.   Neck: Normal range of motion. Neck supple.  Cardiovascular: Normal rate, regular rhythm and normal heart sounds.   Respiratory: Effort normal and breath sounds normal.  GI: Soft. There is no tenderness.  Genitourinary: No bleeding around the vagina. Vaginal discharge (mucusy) found.  Neurological: She is alert and oriented to person, place, and time.  Skin: Skin is warm and dry.   Dilation: 3.5 Effacement (%): 50 Cervical Position: Posterior Exam by:: Margarita Mail, CNM  FHR 130's, +accels Toco - irregular  MAU Course  Procedures  Results for orders placed during the hospital encounter of 01/19/14 (from the past 24 hour(s))  WET PREP, GENITAL     Status: Abnormal   Collection Time    01/19/14  3:00 PM      Result Value Ref Range   Yeast Wet Prep HPF POC NONE SEEN  NONE SEEN   Trich, Wet Prep NONE SEEN  NONE SEEN   Clue Cells Wet Prep HPF POC FEW (*) NONE SEEN   WBC, Wet Prep HPF POC MANY (*) NONE SEEN   Consulted with Dr. Vincente Poli > reviewed HPI/OB history/exam/labs/fetal tracing > discharge to home with follow-up in office tomorrow.  Assessment and Plan  23 yo G2P1001 at [redacted]w[redacted]d wks IUP Bacterial Vaginosis Category I FHR Tracing  Plan: Discharge to home RX Flagyl 500 mg BID x 7 days Labor precautions Follow-up in office tomorrow  Marlis Edelson 01/19/2014, 2:45 PM

## 2014-01-19 NOTE — Discharge Instructions (Signed)
Bacterial Vaginosis Bacterial vaginosis is a vaginal infection that occurs when the normal balance of bacteria in the vagina is disrupted. It results from an overgrowth of certain bacteria. This is the most common vaginal infection in women of childbearing age. Treatment is important to prevent complications, especially in pregnant women, as it can cause a premature delivery. CAUSES  Bacterial vaginosis is caused by an increase in harmful bacteria that are normally present in smaller amounts in the vagina. Several different kinds of bacteria can cause bacterial vaginosis. However, the reason that the condition develops is not fully understood. RISK FACTORS Certain activities or behaviors can put you at an increased risk of developing bacterial vaginosis, including:  Having a new sex partner or multiple sex partners.  Douching.  Using an intrauterine device (IUD) for contraception. Women do not get bacterial vaginosis from toilet seats, bedding, swimming pools, or contact with objects around them. SIGNS AND SYMPTOMS  Some women with bacterial vaginosis have no signs or symptoms. Common symptoms include:  Grey vaginal discharge.  A fishlike odor with discharge, especially after sexual intercourse.  Itching or burning of the vagina and vulva.  Burning or pain with urination. DIAGNOSIS  Your health care provider will take a medical history and examine the vagina for signs of bacterial vaginosis. A sample of vaginal fluid may be taken. Your health care provider will look at this sample under a microscope to check for bacteria and abnormal cells. A vaginal pH test may also be done.  TREATMENT  Bacterial vaginosis may be treated with antibiotic medicines. These may be given in the form of a pill or a vaginal cream. A second round of antibiotics may be prescribed if the condition comes back after treatment.  HOME CARE INSTRUCTIONS   Only take over-the-counter or prescription medicines as  directed by your health care provider.  If antibiotic medicine was prescribed, take it as directed. Make sure you finish it even if you start to feel better.  Do not have sex until treatment is completed.  Tell all sexual partners that you have a vaginal infection. They should see their health care provider and be treated if they have problems, such as a mild rash or itching.  Practice safe sex by using condoms and only having one sex partner. SEEK MEDICAL CARE IF:   Your symptoms are not improving after 3 days of treatment.  You have increased discharge or pain.  You have a fever. MAKE SURE YOU:   Understand these instructions.  Will watch your condition.  Will get help right away if you are not doing well or get worse. FOR MORE INFORMATION  Centers for Disease Control and Prevention, Division of STD Prevention: www.cdc.gov/std American Sexual Health Association (ASHA): www.ashastd.org  Document Released: 04/23/2005 Document Revised: 02/11/2013 Document Reviewed: 12/03/2012 ExitCare Patient Information 2015 [employer]. This information is not intended to replace advice given to you by your health care provider. Make sure you discuss any questions you have with your health care provider.  

## 2014-01-19 NOTE — MAU Note (Signed)
Patient states she has had vaginal bleeding for the past 2 days. Not wearing a pad on arrival to MAU. States she has had fetal movement but not 10 movements as instructed in the last couple of days.

## 2014-01-20 LAB — URINE CULTURE
COLONY COUNT: NO GROWTH
Culture: NO GROWTH

## 2014-01-21 ENCOUNTER — Inpatient Hospital Stay (HOSPITAL_COMMUNITY)
Admission: AD | Admit: 2014-01-21 | Discharge: 2014-01-23 | DRG: 774 | Disposition: A | Discharge status: Home / Self Care | Payer: Medicaid Other | Source: Ambulatory Visit | Attending: Obstetrics and Gynecology | Admitting: Obstetrics and Gynecology

## 2014-01-21 ENCOUNTER — Encounter (HOSPITAL_COMMUNITY): Payer: Self-pay | Admitting: *Deleted

## 2014-01-21 DIAGNOSIS — O98519 Other viral diseases complicating pregnancy, unspecified trimester: Principal | ICD-10-CM | POA: Diagnosis present

## 2014-01-21 DIAGNOSIS — O9989 Other specified diseases and conditions complicating pregnancy, childbirth and the puerperium: Secondary | ICD-10-CM

## 2014-01-21 DIAGNOSIS — O99892 Other specified diseases and conditions complicating childbirth: Secondary | ICD-10-CM | POA: Diagnosis present

## 2014-01-21 DIAGNOSIS — Z3490 Encounter for supervision of normal pregnancy, unspecified, unspecified trimester: Secondary | ICD-10-CM

## 2014-01-21 DIAGNOSIS — M4120 Other idiopathic scoliosis, site unspecified: Secondary | ICD-10-CM | POA: Diagnosis present

## 2014-01-21 DIAGNOSIS — Z833 Family history of diabetes mellitus: Secondary | ICD-10-CM | POA: Diagnosis not present

## 2014-01-21 DIAGNOSIS — A6000 Herpesviral infection of urogenital system, unspecified: Secondary | ICD-10-CM | POA: Diagnosis present

## 2014-01-21 DIAGNOSIS — O479 False labor, unspecified: Secondary | ICD-10-CM | POA: Diagnosis present

## 2014-01-21 HISTORY — DX: Scoliosis, unspecified: M41.9

## 2014-01-21 HISTORY — DX: Encounter for supervision of normal pregnancy, unspecified, unspecified trimester: Z34.90

## 2014-01-21 LAB — CBC
HCT: 33.7 % — ABNORMAL LOW (ref 36.0–46.0)
HEMOGLOBIN: 11 g/dL — AB (ref 12.0–15.0)
MCH: 25.9 pg — AB (ref 26.0–34.0)
MCHC: 32.6 g/dL (ref 30.0–36.0)
MCV: 79.5 fL (ref 78.0–100.0)
Platelets: 188 10*3/uL (ref 150–400)
RBC: 4.24 MIL/uL (ref 3.87–5.11)
RDW: 14.8 % (ref 11.5–15.5)
WBC: 9.2 10*3/uL (ref 4.0–10.5)

## 2014-01-21 LAB — TYPE AND SCREEN
ABO/RH(D): B POS
ANTIBODY SCREEN: NEGATIVE

## 2014-01-21 MED ORDER — CITRIC ACID-SODIUM CITRATE 334-500 MG/5ML PO SOLN: 30.0000 mL | ORAL | Status: DC | PRN

## 2014-01-21 MED ORDER — OXYCODONE-ACETAMINOPHEN 5-325 MG PO TABS: 2.0000 | ORAL_TABLET | ORAL | Status: DC | PRN

## 2014-01-21 MED ORDER — ZOLPIDEM TARTRATE 5 MG PO TABS: 5.0000 mg | ORAL_TABLET | Freq: Every evening | ORAL | Status: DC | PRN

## 2014-01-21 MED ORDER — LIDOCAINE HCL (PF) 1 % IJ SOLN
30.0000 mL | INTRAMUSCULAR | Status: DC | PRN
  Filled 2014-01-21: qty 30

## 2014-01-21 MED ORDER — FLEET ENEMA 7-19 GM/118ML RE ENEM
1.0000 | ENEMA | Freq: Every day | RECTAL | Status: DC | PRN
Start: 2014-01-21 — End: 2014-01-23

## 2014-01-21 MED ORDER — TETANUS-DIPHTH-ACELL PERTUSSIS 5-2.5-18.5 LF-MCG/0.5 IM SUSP
0.5000 mL | Freq: Once | INTRAMUSCULAR | Status: DC
Start: 2014-01-22 — End: 2014-01-23

## 2014-01-21 MED ORDER — LANOLIN HYDROUS EX OINT: TOPICAL_OINTMENT | CUTANEOUS | Status: DC | PRN

## 2014-01-21 MED ORDER — ACETAMINOPHEN 325 MG PO TABS: 650.0000 mg | ORAL_TABLET | ORAL | Status: DC | PRN

## 2014-01-21 MED ORDER — IBUPROFEN 600 MG PO TABS
600.0000 mg | ORAL_TABLET | Freq: Four times a day (QID) | ORAL | Status: DC
  Administered 2014-01-22 – 2014-01-23 (×6): 600 mg via ORAL
  Filled 2014-01-21 (×6): qty 1

## 2014-01-21 MED ORDER — OXYCODONE-ACETAMINOPHEN 5-325 MG PO TABS: 1.0000 | ORAL_TABLET | ORAL | Status: DC | PRN

## 2014-01-21 MED ORDER — FLEET ENEMA 7-19 GM/118ML RE ENEM: 1.0000 | ENEMA | RECTAL | Status: DC | PRN

## 2014-01-21 MED ORDER — WITCH HAZEL-GLYCERIN EX PADS: 1.0000 "application " | MEDICATED_PAD | CUTANEOUS | Status: DC | PRN

## 2014-01-21 MED ORDER — OXYTOCIN 40 UNITS IN LACTATED RINGERS INFUSION - SIMPLE MED
62.5000 mL/h | INTRAVENOUS | Status: DC
  Administered 2014-01-21: 62.5 mL/h via INTRAVENOUS
  Filled 2014-01-21: qty 1000

## 2014-01-21 MED ORDER — BISACODYL 10 MG RE SUPP: 10.0000 mg | Freq: Every day | RECTAL | Status: DC | PRN

## 2014-01-21 MED ORDER — SIMETHICONE 80 MG PO CHEW: 80.0000 mg | CHEWABLE_TABLET | ORAL | Status: DC | PRN

## 2014-01-21 MED ORDER — BENZOCAINE-MENTHOL 20-0.5 % EX AERO
1.0000 "application " | INHALATION_SPRAY | CUTANEOUS | Status: DC | PRN
  Filled 2014-01-21: qty 56

## 2014-01-21 MED ORDER — ONDANSETRON HCL 4 MG/2ML IJ SOLN: 4.0000 mg | INTRAMUSCULAR | Status: DC | PRN

## 2014-01-21 MED ORDER — PRENATAL MULTIVITAMIN CH
1.0000 | ORAL_TABLET | Freq: Every day | ORAL | Status: DC
  Administered 2014-01-22: 1 via ORAL
  Filled 2014-01-21: qty 1

## 2014-01-21 MED ORDER — SENNOSIDES-DOCUSATE SODIUM 8.6-50 MG PO TABS
2.0000 | ORAL_TABLET | ORAL | Status: DC
  Administered 2014-01-22 (×2): 2 via ORAL
  Filled 2014-01-21 (×2): qty 2

## 2014-01-21 MED ORDER — LACTATED RINGERS IV SOLN: INTRAVENOUS | Status: DC

## 2014-01-21 MED ORDER — DIPHENHYDRAMINE HCL 25 MG PO CAPS: 25.0000 mg | ORAL_CAPSULE | Freq: Four times a day (QID) | ORAL | Status: DC | PRN

## 2014-01-21 MED ORDER — OXYTOCIN BOLUS FROM INFUSION: 500.0000 mL | INTRAVENOUS | Status: DC

## 2014-01-21 MED ORDER — ONDANSETRON HCL 4 MG/2ML IJ SOLN
4.0000 mg | Freq: Four times a day (QID) | INTRAMUSCULAR | Status: DC | PRN
Start: 2014-01-21 — End: 2014-01-21

## 2014-01-21 MED ORDER — LACTATED RINGERS IV SOLN
500.0000 mL | INTRAVENOUS | Status: DC | PRN
Start: 2014-01-21 — End: 2014-01-21

## 2014-01-21 MED ORDER — DIBUCAINE 1 % RE OINT: 1.0000 "application " | TOPICAL_OINTMENT | RECTAL | Status: DC | PRN

## 2014-01-21 MED ORDER — ONDANSETRON HCL 4 MG PO TABS: 4.0000 mg | ORAL_TABLET | ORAL | Status: DC | PRN

## 2014-01-21 NOTE — H&P (Signed)
Suzanne Flowers is a 23 y.o. female presenting at 39+ with sol.  Prior cesarean section desire trial of labor.  History of genital herpes no active lesions or prodromal symptoms. Negative gbs. Maternal Medical History:  Reason for admission: Contractions.   Contractions: Onset was 1-2 hours ago.   Frequency: regular.   Perceived severity is strong.    Fetal activity: Perceived fetal activity is normal.    Prenatal complications: History of genital hsv  No active lesions or prodromal symptoms no lesions noted.  Prior cesarean section desire tol  Prenatal Complications - Diabetes: none.    OB History   Grav Para Term Preterm Abortions TAB SAB Ect Mult Living   Past Medical History  Diagnosis Date  . Medical history non-contributory   . HSV infection   . Scoliosis    Past Surgical History  Procedure Laterality Date  . Back surgery    . Cesarean section N/A 01/18/2013    Procedure: Primary cesarean section with delivery of baby girl at 1826. Apgars 9/9.;  Surgeon: Zelphia Cairo, MD;  Location: [facility];  Service: Obstetrics;  Laterality: N/A;   Family History: family history includes Diabetes in her maternal grandmother, paternal grandfather, and paternal grandmother. Social History:  reports that she has never smoked. She does not have any smokeless tobacco history on file. She reports that she does not drink alcohol or use illicit drugs.   Prenatal Transfer Tool  Maternal Diabetes: No Genetic Screening: Normal Maternal Ultrasounds/Referrals: Normal Fetal Ultrasounds or other Referrals:  None Maternal Substance Abuse:  No Significant Maternal Medications:  None Significant Maternal Lab Results:  None Other Comments:  None  ROS  Dilation: 7.5 Effacement (%): 90 Station: -1 Exam by:: Alla German RN Blood pressure 103/82, pulse 101, temperature 97.5 F (36.4 C), temperature source Oral, resp. rate 20, last menstrual period 05/15/2013, not currently  breastfeeding. Maternal Exam:  Uterine Assessment: Contraction strength is moderate.  Contraction frequency is regular.   Abdomen: Patient reports no abdominal tenderness. Surgical scars: low transverse.   Fundal height is c/w dates.   Estimated fetal weight is 7.   Fetal presentation: vertex  Introitus: Amniotic fluid character: clear.  Cervix: 8 cm and 80 % vtx -1   Fetal Exam Fetal State Assessment: Category I - tracings are normal.     Physical Exam  Prenatal labs: ABO, Rh: B/Positive/-- (03/26 0000) Antibody: Negative (03/26 0000) Rubella: Immune (03/26 0000) RPR: Nonreactive (03/26 0000)  HBsAg: Negative (03/26 0000)  HIV: Non-reactive (03/26 0000)  GBS: Negative (09/02 0000)   Assessment/Plan: IUP at term with SOL Prior cesarean section desire TOL History of genital herpes not active lesions or prodromal symptoms Plan:  TOL risk discussed with uterine rupture with associated maternal and fetal complications. Including maternal transfusions and possible hysterectomy.  Fetal damage or death from abruption.   Suzanne Flowers S 01/21/2014, 9:24 PM

## 2014-01-22 ENCOUNTER — Encounter (HOSPITAL_COMMUNITY): Payer: Self-pay | Admitting: *Deleted

## 2014-01-22 LAB — CBC
HCT: 28.8 % — ABNORMAL LOW (ref 36.0–46.0)
Hemoglobin: 9.5 g/dL — ABNORMAL LOW (ref 12.0–15.0)
MCH: 26.2 pg (ref 26.0–34.0)
MCHC: 33 g/dL (ref 30.0–36.0)
MCV: 79.3 fL (ref 78.0–100.0)
Platelets: 165 10*3/uL (ref 150–400)
RBC: 3.63 MIL/uL — ABNORMAL LOW (ref 3.87–5.11)
RDW: 14.6 % (ref 11.5–15.5)
WBC: 13.4 10*3/uL — AB (ref 4.0–10.5)

## 2014-01-22 LAB — ABO/RH: ABO/RH(D): B POS

## 2014-01-22 LAB — RPR

## 2014-01-22 NOTE — Lactation Note (Signed)
This note was copied from the chart of Suzanne Shelbi Vaccaro. Lactation Consultation Note  P2, Mother has large short shaft nipples.  Had difficulty latching first baby so she pumped for 3 months.   Mother has a good breastmilk supply. Attempted breastfeeding in football and cross cradle hold.  Baby had difficult time sustaining latch. Mother has brought her own hand pump and had already given baby a bottle of pumped breastmilk. Tried to latch with #24 NS but baby would not suck. Demonstrated how to use foley cup and curved tip syringe. Finger fed baby 2 ml of colostrum.  Mom encouraged to feed baby 8-12 times/24 hours and with feeding cues.  Mom made aware of O/P services, breastfeeding support groups, community resources, and our phone # for post-discharge questions.       Patient Name: Suzanne Flowers [id number] Date: 01/22/2014 Reason for consult: Initial assessment   Maternal Data Has patient been taught Hand Expression?: Yes Does the patient have breastfeeding experience prior to this delivery?: Yes  Feeding Feeding Type: Breast Fed  LATCH Score/Interventions Latch: Repeated attempts needed to sustain latch, nipple held in mouth throughout feeding, stimulation needed to elicit sucking reflex. Intervention(s): Breast massage;Adjust position;Assist with latch;Breast compression  Audible Swallowing: A few with stimulation Intervention(s): Alternate breast massage  Type of Nipple: Everted at rest and after stimulation  Comfort (Breast/Nipple): Soft / non-tender     Hold (Positioning): Assistance needed to correctly position infant at breast and maintain latch. Intervention(s): Support Pillows;Position options;Skin to skin  LATCH Score: 7  Lactation Tools Discussed/Used     Consult Status Consult Status: Follow-up Date: 01/23/14 Follow-up type: In-patient    Suzanne Flowers [facility] 01/22/2014, 12:11 PM

## 2014-01-22 NOTE — Progress Notes (Signed)
UR chart review completed.  

## 2014-01-22 NOTE — Progress Notes (Signed)
Post Partum Day 1 Subjective: no complaints, up ad lib, voiding and tolerating PO  Objective: Blood pressure 104/58, pulse 78, temperature 98.9 F (37.2 C), temperature source Oral, resp. rate 18, last menstrual period 05/15/2013, SpO2 100.00%, unknown if currently breastfeeding.  Physical Exam:  General: alert, cooperative and appears stated age Lochia: appropriate Uterine Fundus: firm Incision: perineum intact DVT Evaluation: No evidence of DVT seen on physical exam. Negative Homan's sign. No cords or calf tenderness. No significant calf/ankle edema.   Recent Labs  01/21/14 2100 01/22/14 0604  HGB 11.0* 9.5*  HCT 33.7* 28.8*    Assessment/Plan: Plan for discharge tomorrow and Circumcision prior to discharge   LOS: 1 day   Leanor Voris G 01/22/2014, 8:17 AM

## 2014-01-23 NOTE — Discharge Summary (Signed)
Obstetric Discharge Summary Reason for Admission: onset of labor Prenatal Procedures: none Intrapartum Procedures: spontaneous vaginal delivery Postpartum Procedures: none Complications-Operative and Postpartum: none Hemoglobin  Date Value Ref Range Status  01/22/2014 9.5* 12.0 - 15.0 g/dL Final     HCT  Date Value Ref Range Status  01/22/2014 28.8* 36.0 - 46.0 % Final    Physical Exam:  General: alert Lochia: appropriate Uterine Fundus: firm Incision: healing well DVT Evaluation: No evidence of DVT seen on physical exam.  Discharge Diagnoses: Term Pregnancy-delivered, VBAC  Discharge Information: Date: 01/23/2014 Activity: pelvic rest Diet: routine Medications: PNV Condition: stable Instructions: refer to practice specific booklet Discharge to: home Follow-up Information   Follow up with Meriel Pica, MD. Schedule an appointment as soon as possible for a visit in 6 weeks.   Specialty:  Obstetrics and Gynecology   Contact information:   [address] [place] [address] [phone]       Newborn Data: Live born female  Birth Weight: 6 lb 6 oz (2892 g) APGAR: 9, 9  Home with mother.  Suzanne Flowers M 01/23/2014, 7:17 AM

## 2014-01-23 NOTE — Lactation Note (Signed)
This note was copied from the chart of Suzanne Flowers. Lactation Consultation Note  Patient Name: Suzanne Flowers [id number] Date: 01/23/2014 Reason for consult: Follow-up assessment - Per mom the baby recently fed at 0800 for 10 mins , per mom the latching is getting better. LC encouraged mom to work on giving the baby as much practice latching as possible at the breast. LC reviewed basics - breast massage , hand express, latch with firm support and breast compressions and then intermittent  With feeding. Reviewed sore nipple and engorgement prevention and tx . Review set up of the hand pump , #42 Flange proper fit for to today .  #27 Flange given for when the milk comes in . Reference the Baby and me booklet. Mother informed of post-discharge support and given phone number to the lactation department, including services for phone call assistance;  out-patient appointments; and breastfeeding support group. List of other breastfeeding resources in the community given in the handout. Encouraged mother to call for problems or concerns related to breastfeeding.   Maternal Data Formula Feeding for Exclusion: No  Feeding Feeding Type:  (per mom recently fed at 0800 for 10 mins ) Length of feed: 10 min  LATCH Score/Interventions                Intervention(s): Breastfeeding basics reviewed (see LC note )     Lactation Tools Discussed/Used Tools: Flanges Flange Size: 27 WIC Program: Yes (per mom ) Pump Review: Setup, frequency, and cleaning;Milk Storage Initiated by:: mom already has the hand pump , LC reviewed set up , checked the flange size, for today 342 flange proper fit , LC gave mom a #27 flange for when the milk comes in    Consult Status Consult Status: Complete Date: 01/23/14    Suzanne Flowers 01/23/2014, 9:54 AM

## 2014-03-08 ENCOUNTER — Encounter (HOSPITAL_COMMUNITY): Payer: Self-pay | Admitting: *Deleted

## 2014-05-07 NOTE — L&D Delivery Note (Addendum)
Final Labor Progress Note At 1730 the nurse called to report pt has increase pressure and the urge to push.  VE C/C/+3. FHR remained reassuring. It was reported by the pt that she spontaneous rupture of membranes today, at 0900, clear. Pt desires to take her placenta home.   Vaginal Delivery Note Cervical dilation was complete at 1735.  NICHD Category 1.    Spontaneous self directed pushing began at 1735.   While pushing it was note the membranes appeared to be intact.  The head of the infant was crowning while in the caul.  After 4 minutes of pushing the head, shoulders and the body of a viable female infant "Suzanne Flowers" delivered spontaneously with maternal effort in the ROA position completely in the caul at 1739.     The membranes were removed and the infant was placed on moms abd.   The pt requested delayed cord clamping.  The infants eyes were open but there was a slight delay in cry so the the cord was clamped, cut and the infant was handed to the nursing staff for immediate assessment.  Cord gas was obtained and sent to the lab.  With-in minutes the infant was returned to the mother and put skin to skin.  Cord blood was also obtained for evaluation.   Spontaneous delivery of a intact placenta with a 3 vessel cord via Shultz at 1750.   Episiotomy: None   The vulva, perineum, vaginal vault, rectum and cervix were inspected and revealed superficial bilateral periurethral laceration that was which was not repaired a the pts request.    IV site was loss t/f IM pitocin was ordered and given in the right thigh.  The bleeding continue more that typical so fundal massage and 1000 mcg of Cytotec PR was given.  Fundus became firm, lochia was appropriate with bleeding under control.  Soon after the bleeding increased and the uterus became boggy again.  IV site was re-esteblished with a 18g in the right FA and a foley cath was placed.  EBL at this stage was >700.  PPH was called, Dr Normand Sloop was called to  the bedside and SQ methergine was given.  Attempted to start a second IV site, but the pt refused.  With fundal massage a large clot was expressed.  Once again the bleeding slowed down. Shortly after the bleeding restarted. Dr Normand Sloop arrived at the bedside, pt was consented for manual exploration of the uterus, EUA, possible D&C, Bakri balloon and hysterectomy.  See Dr Redmond Baseman note for further details.        Total QBL 2000. Pre delivery Hgb 10.2  Sponge, laps and needle count correct and verified with the primary care nurse.    Routine postpartum orders Ancef x 24 hours Methergine series x 24 hours  Mother desires Nexplanon for contraception   Placenta to pathology: YES, as a result of manual removal of what appears to be remaining tissue Cord Gases sent to lab: YES    PH 7.383 Cord blood sent to lab: YES    APGARS:  7 at 1 minute and 7 at 5 minutes Weight: 6lb 7.3oz     Both mom and baby were left in stable condition, baby skin to skin.      Suzanne Flowers, CNM, MSN 01/08/2015. 8:15 PM   Addendum 2100 I was called to the room by the primary care nurse.  Bleeding was scant and they were planning on transferring the pt to the mother baby unit.  It was reported that once she stood up she became dizzy and experienced N/V.  BP was low at 74/34.  IVF bolus ordered and order to remain on L&D for further assessment.  Hbg post delivery/PPH 7.9.  CBC to be redrawn at 2300.  Possible need for a blood transfusion was explained. Pt expressed understanding and agreed.

## 2014-06-08 ENCOUNTER — Encounter (HOSPITAL_COMMUNITY): Payer: Self-pay | Admitting: *Deleted

## 2014-06-08 ENCOUNTER — Inpatient Hospital Stay (HOSPITAL_COMMUNITY)
Admission: AD | Admit: 2014-06-08 | Discharge: 2014-06-08 | Discharge status: Against Medical Advice | Payer: 59 | Source: Ambulatory Visit | Attending: Family Medicine | Admitting: Family Medicine

## 2014-06-08 DIAGNOSIS — Z3201 Encounter for pregnancy test, result positive: Secondary | ICD-10-CM | POA: Diagnosis not present

## 2014-06-08 DIAGNOSIS — Z3200 Encounter for pregnancy test, result unknown: Secondary | ICD-10-CM | POA: Diagnosis present

## 2014-06-08 LAB — POCT PREGNANCY, URINE: PREG TEST UR: POSITIVE — AB

## 2014-06-08 NOTE — MAU Note (Signed)
Urine in lab 

## 2014-06-08 NOTE — MAU Note (Signed)
Pt still breast feeding, had unprotected sex- that is why she did preg test. No palpable fundus, unable to hear FH.

## 2014-06-08 NOTE — MAU Note (Signed)
Needs a preg testconfirmation per abortion clinic. +HPT. No pain or bleeding;

## 2014-08-27 ENCOUNTER — Inpatient Hospital Stay (HOSPITAL_COMMUNITY)
Admission: AD | Admit: 2014-08-27 | Discharge: 2014-08-27 | Disposition: A | Discharge status: Home / Self Care | Payer: Medicaid Other | Source: Ambulatory Visit | Attending: Obstetrics and Gynecology | Admitting: Obstetrics and Gynecology

## 2014-08-27 ENCOUNTER — Encounter (HOSPITAL_COMMUNITY): Payer: Self-pay | Admitting: *Deleted

## 2014-08-27 DIAGNOSIS — J069 Acute upper respiratory infection, unspecified: Secondary | ICD-10-CM

## 2014-08-27 DIAGNOSIS — J029 Acute pharyngitis, unspecified: Secondary | ICD-10-CM | POA: Insufficient documentation

## 2014-08-27 DIAGNOSIS — O210 Mild hyperemesis gravidarum: Secondary | ICD-10-CM | POA: Diagnosis not present

## 2014-08-27 DIAGNOSIS — Z3A17 17 weeks gestation of pregnancy: Secondary | ICD-10-CM | POA: Diagnosis not present

## 2014-08-27 DIAGNOSIS — R197 Diarrhea, unspecified: Secondary | ICD-10-CM | POA: Diagnosis not present

## 2014-08-27 DIAGNOSIS — O9989 Other specified diseases and conditions complicating pregnancy, childbirth and the puerperium: Secondary | ICD-10-CM | POA: Diagnosis not present

## 2014-08-27 DIAGNOSIS — O219 Vomiting of pregnancy, unspecified: Secondary | ICD-10-CM

## 2014-08-27 LAB — URINALYSIS, ROUTINE W REFLEX MICROSCOPIC
Bilirubin Urine: NEGATIVE
GLUCOSE, UA: NEGATIVE mg/dL
HGB URINE DIPSTICK: NEGATIVE
KETONES UR: NEGATIVE mg/dL
Nitrite: NEGATIVE
PH: 7 (ref 5.0–8.0)
PROTEIN: NEGATIVE mg/dL
Specific Gravity, Urine: 1.025 (ref 1.005–1.030)
Urobilinogen, UA: 0.2 mg/dL (ref 0.0–1.0)

## 2014-08-27 LAB — URINE MICROSCOPIC-ADD ON

## 2014-08-27 LAB — RAPID STREP SCREEN (MED CTR MEBANE ONLY): Streptococcus, Group A Screen (Direct): NEGATIVE

## 2014-08-27 MED ORDER — PROMETHAZINE HCL 25 MG PO TABS: 12.5000 mg | ORAL_TABLET | Freq: Four times a day (QID) | ORAL | Status: DC | PRN

## 2014-08-27 NOTE — Discharge Instructions (Signed)
Food Choices to Help Relieve Diarrhea °When you have diarrhea, the foods you eat and your eating habits are very important. Choosing the right foods and drinks can help relieve diarrhea. Also, because diarrhea can last up to 7 days, you need to replace lost fluids and electrolytes (such as sodium, potassium, and chloride) in order to help prevent dehydration.  °WHAT GENERAL GUIDELINES DO I NEED TO FOLLOW? °· Slowly drink 1 cup (8 oz) of fluid for each episode of diarrhea. If you are getting enough fluid, your urine will be clear or pale yellow. °· Eat starchy foods. Some good choices include white rice, white toast, pasta, low-fiber cereal, baked potatoes (without the skin), saltine crackers, and bagels. °· Avoid large servings of any cooked vegetables. °· Limit fruit to two servings per day. A serving is ½ cup or 1 small piece. °· Choose foods with less than 2 g of fiber per serving. °· Limit fats to less than 8 tsp (38 g) per day. °· Avoid fried foods. °· Eat foods that have probiotics in them. Probiotics can be found in certain dairy products. °· Avoid foods and beverages that may increase the speed at which food moves through the stomach and intestines (gastrointestinal tract). Things to avoid include: °¨ High-fiber foods, such as dried fruit, raw fruits and vegetables, nuts, seeds, and whole grain foods. °¨ Spicy foods and high-fat foods. °¨ Foods and beverages sweetened with high-fructose corn syrup, honey, or sugar alcohols such as xylitol, sorbitol, and mannitol. °WHAT FOODS ARE RECOMMENDED? °Grains °White rice. White, French, or pita breads (fresh or toasted), including plain rolls, buns, or bagels. White pasta. Saltine, soda, or graham crackers. Pretzels. Low-fiber cereal. Cooked cereals made with water (such as cornmeal, farina, or cream cereals). Plain muffins. Matzo. Melba toast. Zwieback.  °Vegetables °Potatoes (without the skin). Strained tomato and vegetable juices. Most well-cooked and canned  vegetables without seeds. Tender lettuce. °Fruits °Cooked or canned applesauce, apricots, cherries, fruit cocktail, grapefruit, peaches, pears, or plums. Fresh bananas, apples without skin, cherries, grapes, cantaloupe, grapefruit, peaches, oranges, or plums.  °Meat and Other Protein Products °Baked or boiled chicken. Eggs. Tofu. Fish. Seafood. Smooth peanut butter. Ground or well-cooked tender beef, ham, veal, lamb, pork, or poultry.  °Dairy °Plain yogurt, kefir, and unsweetened liquid yogurt. Lactose-free milk, buttermilk, or soy milk. Plain hard cheese. °Beverages °Sport drinks. Clear broths. Diluted fruit juices (except prune). Regular, caffeine-free sodas such as ginger ale. Water. Decaffeinated teas. Oral rehydration solutions. Sugar-free beverages not sweetened with sugar alcohols. °Other °Bouillon, broth, or soups made from recommended foods.  °The items listed above may not be a complete list of recommended foods or beverages. Contact your dietitian for more options. °WHAT FOODS ARE NOT RECOMMENDED? °Grains °Whole grain, whole wheat, bran, or rye breads, rolls, pastas, crackers, and cereals. Wild or brown rice. Cereals that contain more than 2 g of fiber per serving. Corn tortillas or taco shells. Cooked or dry oatmeal. Granola. Popcorn. °Vegetables °Raw vegetables. Cabbage, broccoli, Brussels sprouts, artichokes, baked beans, beet greens, corn, kale, legumes, peas, sweet potatoes, and yams. Potato skins. Cooked spinach and cabbage. °Fruits °Dried fruit, including raisins and dates. Raw fruits. Stewed or dried prunes. Fresh apples with skin, apricots, mangoes, pears, raspberries, and strawberries.  °Meat and Other Protein Products °Chunky peanut butter. Nuts and seeds. Beans and lentils. Bacon.  °Dairy °High-fat cheeses. Milk, chocolate milk, and beverages made with milk, such as milk shakes. Cream. Ice cream. °Sweets and Desserts °Sweet rolls, doughnuts, and sweet breads. Pancakes   and waffles. Fats and  Oils Butter. Cream sauces. Margarine. Salad oils. Plain salad dressings. Olives. Avocados.  Beverages Caffeinated beverages (such as coffee, tea, soda, or energy drinks). Alcoholic beverages. Fruit juices with pulp. Prune juice. Soft drinks sweetened with high-fructose corn syrup or sugar alcohols. Other Coconut. Hot sauce. Chili powder. Mayonnaise. Gravy. Cream-based or milk-based soups.  The items listed above may not be a complete list of foods and beverages to avoid. Contact your dietitian for more information. WHAT SHOULD I DO IF I BECOME DEHYDRATED? Diarrhea can sometimes lead to dehydration. Signs of dehydration include dark urine and dry mouth and skin. If you think you are dehydrated, you should rehydrate with an oral rehydration solution. These solutions can be purchased at pharmacies, retail stores, or online.  Drink -1 cup (120-240 mL) of oral rehydration solution each time you have an episode of diarrhea. If drinking this amount makes your diarrhea worse, try drinking smaller amounts more often. For example, drink 1-3 tsp (5-15 mL) every 5-10 minutes.  A general rule for staying hydrated is to drink 1-2 L of fluid per day. Talk to your health care provider about the specific amount you should be drinking each day. Drink enough fluids to keep your urine clear or pale yellow. Document Released: 07/14/2003 Document Revised: 04/28/2013 Document Reviewed: 03/16/2013 [facility] Patient Information 2015 [place], [place]. This information is not intended to replace advice given to you by your health care provider. Make sure you discuss any questions you have with your health care provider.   Safe Medications in Pregnancy   Acne: Benzoyl Peroxide Salicylic Acid  Backache/Headache: Tylenol: 2 regular strength every 4 hours OR              2 Extra strength every 6 hours  Colds/Coughs/Allergies: Benadryl (alcohol free) 25 mg every 6 hours as needed Breath right strips Claritin Cepacol  throat lozenges Chloraseptic throat spray Cold-Eeze- up to three times per day Cough drops, alcohol free Flonase (by prescription only) Guaifenesin Mucinex Robitussin DM (plain only, alcohol free) Saline nasal spray/drops Sudafed (pseudoephedrine) & Actifed ** use only after [redacted] weeks gestation and if you do not have high blood pressure Tylenol Vicks Vaporub Zinc lozenges Zyrtec   Constipation: Colace Ducolax suppositories Fleet enema Glycerin suppositories Metamucil Milk of magnesia Miralax Senokot Smooth move tea  Diarrhea: Kaopectate Imodium A-D  *NO pepto Bismol  Hemorrhoids: Anusol Anusol HC Preparation H Tucks  Indigestion: Tums Maalox Mylanta Zantac  Pepcid  Insomnia: Benadryl (alcohol free) 25mg  every 6 hours as needed Tylenol PM Unisom, no Gelcaps  Leg Cramps: Tums MagGel  Nausea/Vomiting:  Bonine Dramamine Emetrol Ginger extract Sea bands Meclizine  Nausea medication to take during pregnancy:  Unisom (doxylamine succinate 25 mg tablets) Take one tablet daily at bedtime. If symptoms are not adequately controlled, the dose can be increased to a maximum recommended dose of two tablets daily (1/2 tablet in the morning, 1/2 tablet mid-afternoon and one at bedtime). Vitamin B6 100mg  tablets. Take one tablet twice a day (up to 200 mg per day).  Skin Rashes: Aveeno products Benadryl cream or 25mg  every 6 hours as needed Calamine Lotion 1% cortisone cream  Yeast infection: Gyne-lotrimin 7 Monistat 7   **If taking multiple medications, please check labels to avoid duplicating the same active ingredients **take medication as directed on the label ** Do not exceed 4000 mg of tylenol in 24 hours **Do not take medications that contain aspirin or ibuprofen

## 2014-08-27 NOTE — MAU Note (Signed)
PT SAYS NO [facility]  YET-  WANTS    TO GO TO [facility]- BUT WAITING ON  MEDICAID.    CAME  HERE - SOMETIME  TO FIND OUT  PREG- WAITED  TOO LONG  SOO LEFT.    FEVER-  STARTED  WED-    DID  NOT TAKE  TEMP-  SAYS  EYES ARE HOT.  SORE THROAT  STARTED X1 WEEK-  USED  OTV SPRAY-  USED 2 HRS AGO0 THEN AWOKE AND FELT  LIKE THROAT WAS   CLOSED.   NO VOMITING-.  HAS HAD WATERY DIARRHEA   YESTERDAY.

## 2014-08-27 NOTE — MAU Note (Signed)
Rapid beta Strep beng obtained.

## 2014-08-27 NOTE — MAU Provider Note (Signed)
History     CSN: [id number]  Arrival date and time: 08/27/14 [id number]   First Provider Initiated Contact with Patient 08/27/14 0358      No chief complaint on file.  HPI Comments: Suzanne Flowers is a 24 y.o. G3P2002 at 4551w5d who presents today with sore throat, congestion and diarrhea. She states that her kids had diarrhea, and then developed cold symptoms right after. She states that she has had the sore throat for a several days, and then she had one episode of watery diarrhea. She states that she has vomited one time as well. She denies any cramping or contractions, vaginal bleeding or LOF. She has felt some flutters of fetal movement.   Sore Throat  This is a new problem. The current episode started in the past 7 days. The problem has been unchanged. Neither side of throat is experiencing more pain than the other. There has been no fever. The pain is at a severity of 5/10. Associated symptoms include diarrhea and vomiting. Pertinent negatives include no abdominal pain or coughing. She has had no exposure to strep or mono. Exposure to: her kids have been sick with a "cold". . She has tried nothing for the symptoms.  Diarrhea  This is a new problem. The current episode started today. The problem occurs less than 2 times per day. The problem has been unchanged. The stool consistency is described as watery. The patient states that diarrhea does not awaken her from sleep. Associated symptoms include vomiting. Pertinent negatives include no abdominal pain, chills, coughing or fever. Nothing aggravates the symptoms. There are no known risk factors. She has tried nothing for the symptoms.   Past Medical History  Diagnosis Date  . Medical history non-contributory   . HSV infection   . Scoliosis     Past Surgical History  Procedure Laterality Date  . Back surgery    . Cesarean section N/A 01/18/2013    Procedure: Primary cesarean section with delivery of baby girl at 1826. Apgars 9/9.;   Surgeon: Zelphia CairoGretchen Adkins, MD;  Location: [facility];  Service: Obstetrics;  Laterality: N/A;    Family History  Problem Relation Age of Onset  . Diabetes Maternal Grandmother   . Diabetes Paternal Grandmother   . Diabetes Paternal Grandfather     History  Substance Use Topics  . Smoking status: Never Smoker   . Smokeless tobacco: Not on file  . Alcohol Use: No    Allergies: No Known Allergies  No prescriptions prior to admission    Review of Systems  Constitutional: Negative for fever and chills.  Respiratory: Negative for cough.   Gastrointestinal: Positive for nausea, vomiting and diarrhea. Negative for abdominal pain.  Genitourinary: Negative for dysuria, urgency and frequency.   Physical Exam   Blood pressure 108/63, pulse 93, temperature 98.3 F (36.8 C), temperature source Oral, resp. rate 18, height 5\' 1"  (1.549 m), weight 58.231 kg (128 lb 6 oz), last menstrual period 04/25/2014, currently breastfeeding.  Physical Exam  Nursing note and vitals reviewed. Constitutional: She is oriented to person, place, and time. She appears well-developed and well-nourished. No distress.  HENT:  Head: Normocephalic.  Mouth/Throat: Oropharynx is clear and moist. No oropharyngeal exudate.  Cardiovascular: Normal rate.   Respiratory: Effort normal and breath sounds normal.  GI: Soft. There is no tenderness. There is no rebound.  Neurological: She is alert and oriented to person, place, and time.  Skin: Skin is warm and dry.  Psychiatric: She has a normal mood  and affect.    MAU Course  Procedures  MDM Offer the patient medication here or RX for home treatment. She would like to have RX for something to take at home.   Assessment and Plan   1. Viral URI   2. Diarrhea   3. Nausea/vomiting in pregnancy    DC home RX phenergan prn #30 tabs  Reviewed safe medications for supportive care Return to MAU as needed [facility] as soon as possible  Follow-up Information     Schedule an appointment as soon as possible for a visit with [facility].   Specialty:  Obstetrics and Gynecology   Contact information:   [address] [place] [address] [phone]     Tawnya Crook 08/27/2014, 4:09 AM

## 2014-08-30 LAB — CULTURE, GROUP A STREP: Strep A Culture: NEGATIVE

## 2014-10-05 ENCOUNTER — Encounter: Payer: Self-pay | Admitting: Advanced Practice Midwife

## 2014-10-05 ENCOUNTER — Encounter: Payer: No Typology Code available for payment source | Admitting: Advanced Practice Midwife

## 2014-10-15 LAB — OB RESULTS CONSOLE GC/CHLAMYDIA
CHLAMYDIA, DNA PROBE: NEGATIVE
GC PROBE AMP, GENITAL: NEGATIVE

## 2014-10-15 LAB — OB RESULTS CONSOLE ANTIBODY SCREEN: Antibody Screen: NEGATIVE

## 2014-10-15 LAB — OB RESULTS CONSOLE ABO/RH: RH Type: POSITIVE

## 2014-10-15 LAB — OB RESULTS CONSOLE RUBELLA ANTIBODY, IGM: RUBELLA: IMMUNE

## 2014-10-15 LAB — OB RESULTS CONSOLE HEPATITIS B SURFACE ANTIGEN: HEP B S AG: NEGATIVE

## 2014-10-15 LAB — OB RESULTS CONSOLE RPR: RPR: NONREACTIVE

## 2014-10-15 LAB — OB RESULTS CONSOLE HIV ANTIBODY (ROUTINE TESTING): HIV: NONREACTIVE

## 2014-10-31 IMAGING — US US OB COMP LESS 14 WK
1 series · 14 of 28 positions shown · non-contrast
Comparison: None.

CLINICAL DATA: Positive pregnancy test.  Viability.

EXAM:
OBSTETRIC <14 WK ULTRASOUND
TECHNIQUE: Transabdominal ultrasound was performed for evaluation of the
gestation as well as the maternal uterus and adnexal regions.

[Series 1: us ob comp less 14 wks · 14 of 28 slices shown]
[im 2/28]
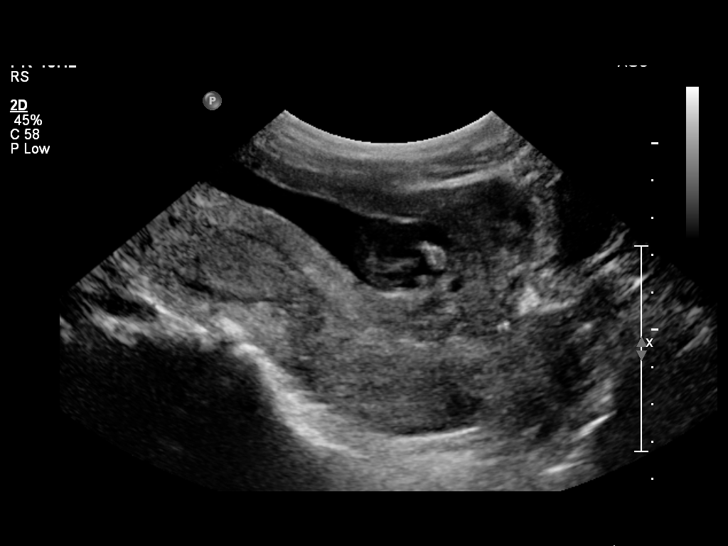
[im 4/28]
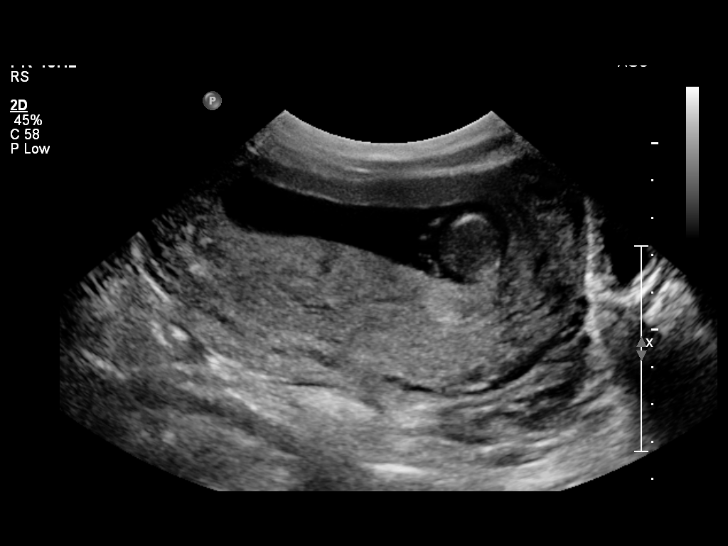
[im 6/28]
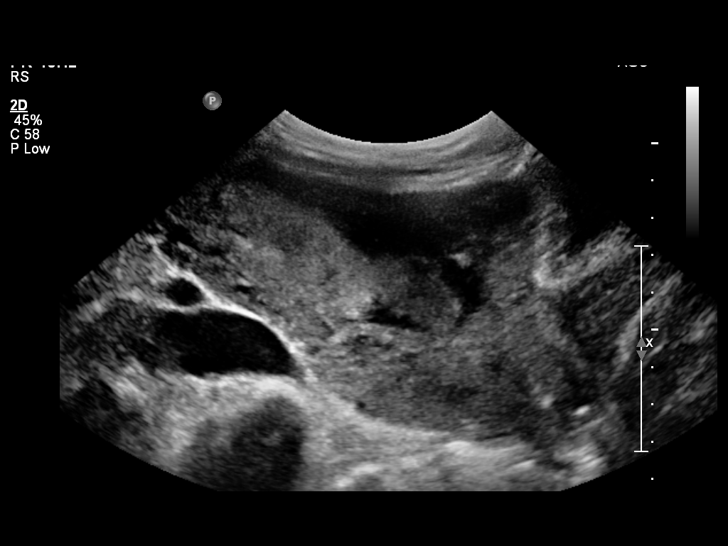
[im 8/28]
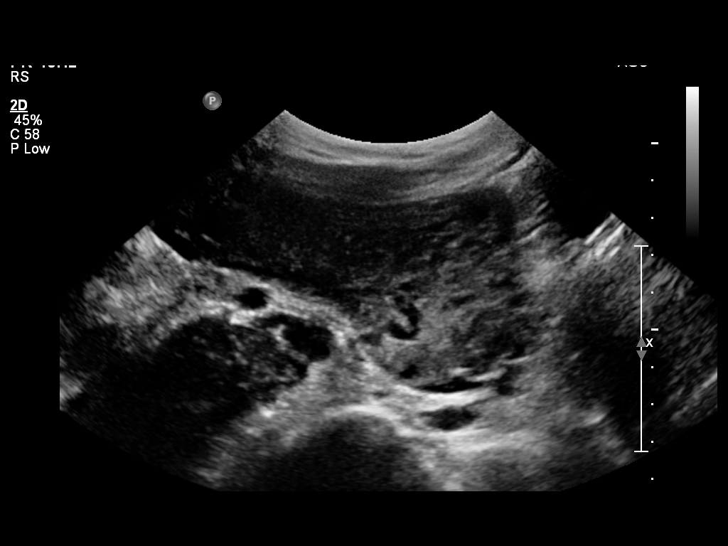
[im 10/28]
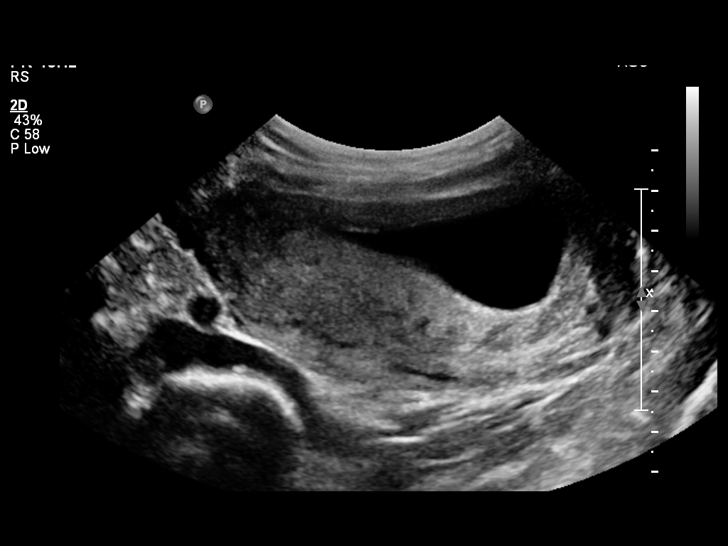
[im 12/28]
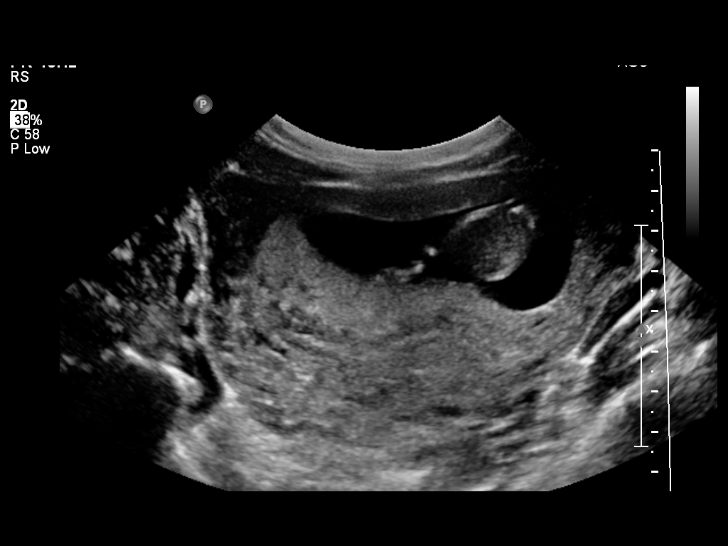
[im 14/28]
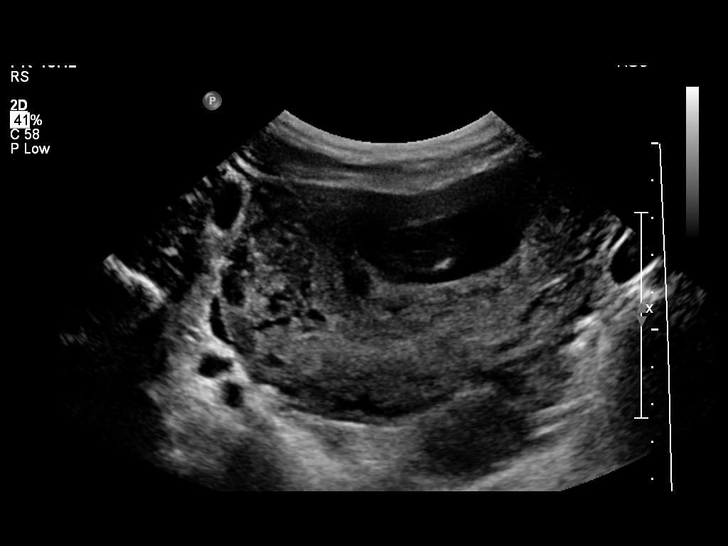
[im 16/28]
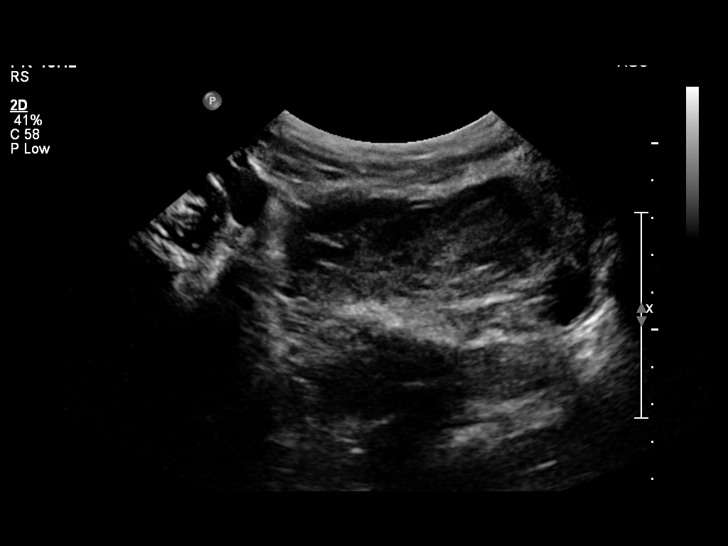
[im 18/28]
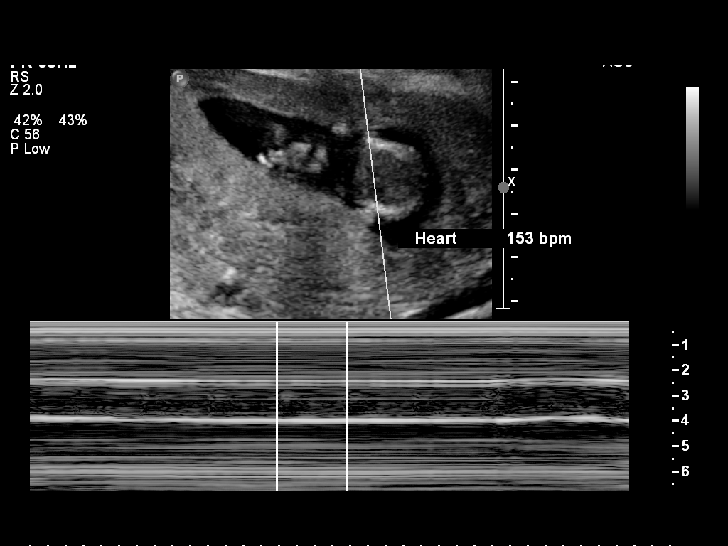
[im 20/28]
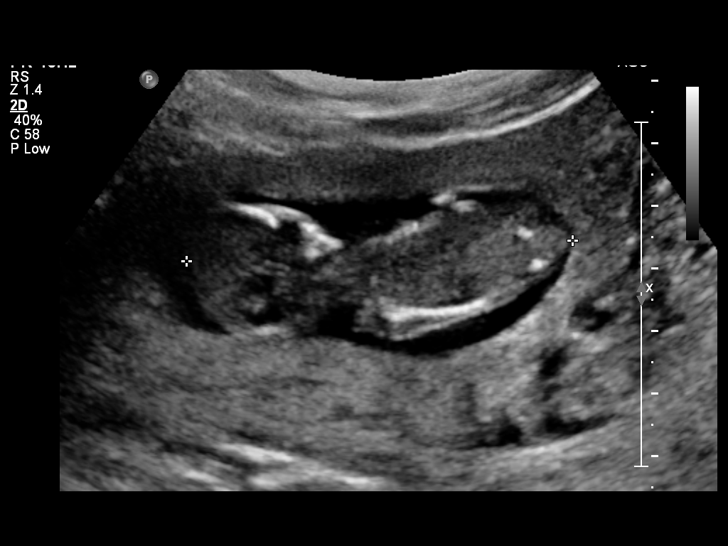
[im 22/28]
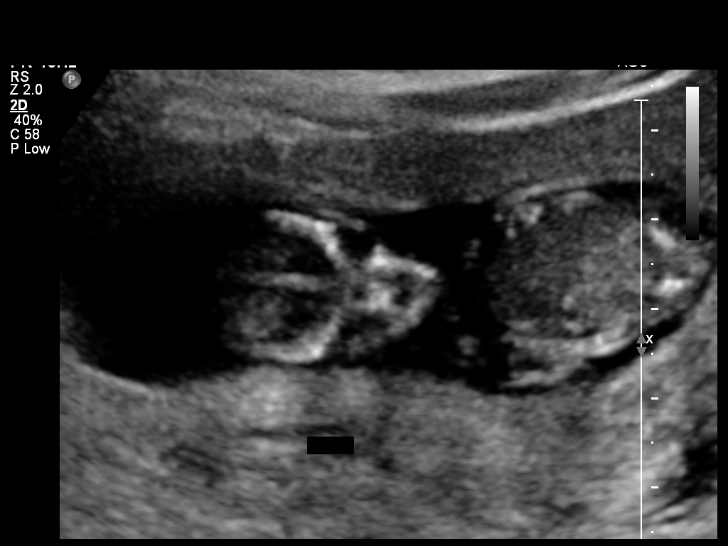
[im 24/28]
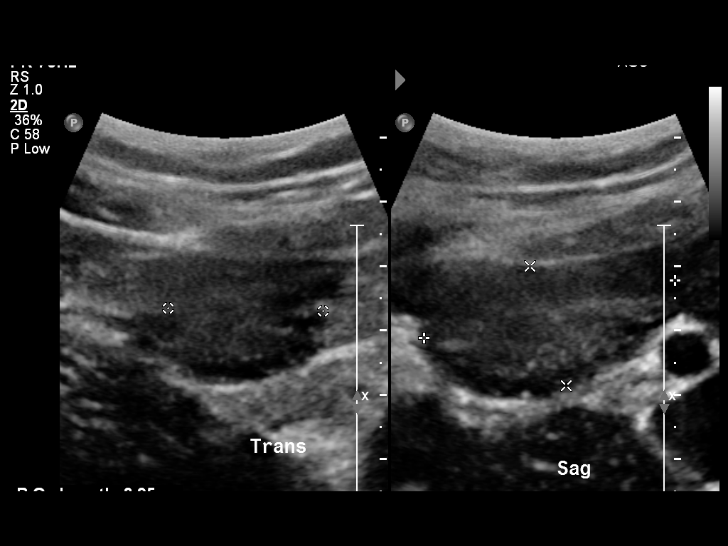
[im 26/28]
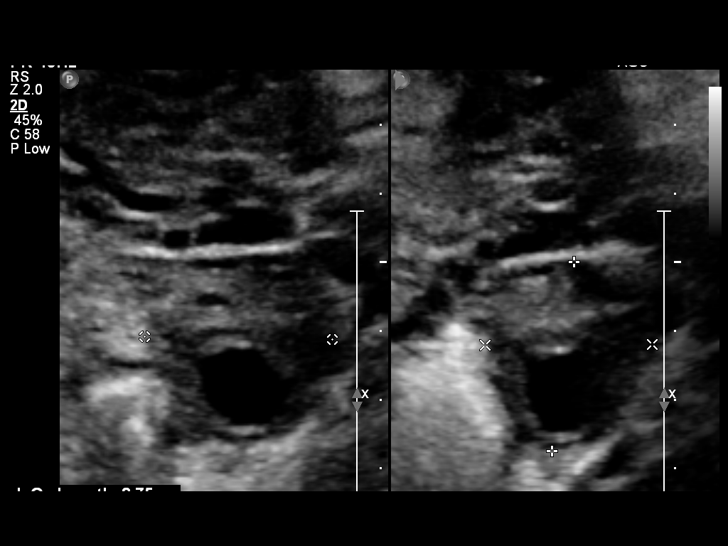
[im 28/28]
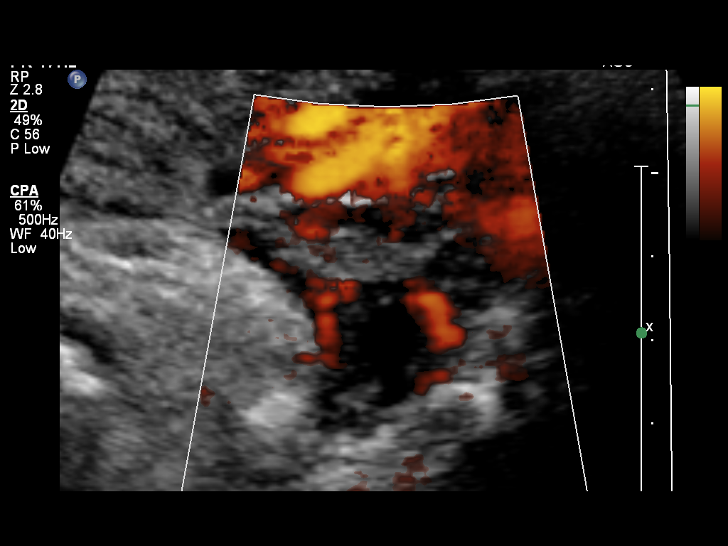

[14 of 28 positions shown; findings below may reference images not displayed]

FINDINGS: Intrauterine gestational sac: Visualized/normal in shape.

Yolk sac:  Not visualized

Embryo:  Visualized

Cardiac Activity: Visualized

Heart Rate: 153 bpm

MSD:   mm    w     d

CRL:   59.7  mm   12 w 4 d                  US EDC: 01/29/2014

Maternal uterus/adnexae: Small subchorionic hemorrhage. Ovaries are
unremarkable. Left corpus luteal cyst. No free fluid.
IMPRESSION: Twelve week 4 day intrauterine pregnancy by crown-rump length. Fetal
heart rate 153 beats per min. Small subchorionic hemorrhage.

## 2014-11-02 ENCOUNTER — Encounter: Payer: Self-pay | Admitting: General Practice

## 2015-01-08 ENCOUNTER — Encounter (HOSPITAL_COMMUNITY): Payer: Self-pay | Admitting: Obstetrics and Gynecology

## 2015-01-08 ENCOUNTER — Inpatient Hospital Stay (HOSPITAL_COMMUNITY)
Admission: AD | Admit: 2015-01-08 | Discharge: 2015-01-10 | DRG: 774 | Disposition: A | Discharge status: Home / Self Care | Payer: No Typology Code available for payment source | Source: Ambulatory Visit | Attending: Obstetrics and Gynecology | Admitting: Obstetrics and Gynecology

## 2015-01-08 DIAGNOSIS — Z98891 History of uterine scar from previous surgery: Secondary | ICD-10-CM

## 2015-01-08 DIAGNOSIS — O0930 Supervision of pregnancy with insufficient antenatal care, unspecified trimester: Secondary | ICD-10-CM

## 2015-01-08 DIAGNOSIS — Z3A37 37 weeks gestation of pregnancy: Secondary | ICD-10-CM | POA: Diagnosis present

## 2015-01-08 DIAGNOSIS — M419 Scoliosis, unspecified: Secondary | ICD-10-CM | POA: Diagnosis present

## 2015-01-08 DIAGNOSIS — O9902 Anemia complicating childbirth: Secondary | ICD-10-CM | POA: Diagnosis present

## 2015-01-08 DIAGNOSIS — O0933 Supervision of pregnancy with insufficient antenatal care, third trimester: Secondary | ICD-10-CM

## 2015-01-08 DIAGNOSIS — A6000 Herpesviral infection of urogenital system, unspecified: Secondary | ICD-10-CM | POA: Diagnosis present

## 2015-01-08 DIAGNOSIS — D649 Anemia, unspecified: Secondary | ICD-10-CM | POA: Diagnosis present

## 2015-01-08 DIAGNOSIS — O9832 Other infections with a predominantly sexual mode of transmission complicating childbirth: Secondary | ICD-10-CM | POA: Diagnosis present

## 2015-01-08 DIAGNOSIS — O34219 Maternal care for unspecified type scar from previous cesarean delivery: Secondary | ICD-10-CM | POA: Diagnosis not present

## 2015-01-08 DIAGNOSIS — O09899 Supervision of other high risk pregnancies, unspecified trimester: Secondary | ICD-10-CM

## 2015-01-08 DIAGNOSIS — Z833 Family history of diabetes mellitus: Secondary | ICD-10-CM | POA: Diagnosis not present

## 2015-01-08 DIAGNOSIS — O3421 Maternal care for scar from previous cesarean delivery: Principal | ICD-10-CM | POA: Diagnosis present

## 2015-01-08 HISTORY — DX: Encounter for supervision of normal pregnancy, unspecified, unspecified trimester: Z34.90

## 2015-01-08 LAB — CBC
HEMATOCRIT: 30.8 % — AB (ref 36.0–46.0)
HEMATOCRIT: 24.2 % — AB (ref 36.0–46.0)
HEMOGLOBIN: 10.2 g/dL — AB (ref 12.0–15.0)
HEMOGLOBIN: 7.9 g/dL — AB (ref 12.0–15.0)
MCH: 26.2 pg (ref 26.0–34.0)
MCH: 25.8 pg — AB (ref 26.0–34.0)
MCHC: 33.1 g/dL (ref 30.0–36.0)
MCHC: 32.6 g/dL (ref 30.0–36.0)
MCV: 79 fL (ref 78.0–100.0)
MCV: 79.1 fL (ref 78.0–100.0)
Platelets: 143 10*3/uL — ABNORMAL LOW (ref 150–400)
Platelets: 136 10*3/uL — ABNORMAL LOW (ref 150–400)
RBC: 3.9 MIL/uL (ref 3.87–5.11)
RBC: 3.06 MIL/uL — AB (ref 3.87–5.11)
RDW: 15.5 % (ref 11.5–15.5)
RDW: 15.5 % (ref 11.5–15.5)
WBC: 15.9 10*3/uL — ABNORMAL HIGH (ref 4.0–10.5)
WBC: 17.6 10*3/uL — ABNORMAL HIGH (ref 4.0–10.5)

## 2015-01-08 LAB — OB RESULTS CONSOLE GBS: STREP GROUP B AG: NEGATIVE

## 2015-01-08 MED ORDER — BENZOCAINE-MENTHOL 20-0.5 % EX AERO: 1.0000 "application " | INHALATION_SPRAY | CUTANEOUS | Status: DC | PRN

## 2015-01-08 MED ORDER — FENTANYL CITRATE (PF) 100 MCG/2ML IJ SOLN
100.0000 ug | INTRAMUSCULAR | Status: DC | PRN
  Administered 2015-01-08: 100 ug via INTRAVENOUS
  Filled 2015-01-08: qty 2

## 2015-01-08 MED ORDER — OXYCODONE-ACETAMINOPHEN 5-325 MG PO TABS: 2.0000 | ORAL_TABLET | ORAL | Status: DC | PRN

## 2015-01-08 MED ORDER — OXYTOCIN BOLUS FROM INFUSION: 500.0000 mL | INTRAVENOUS | Status: DC

## 2015-01-08 MED ORDER — METHYLERGONOVINE MALEATE 0.2 MG/ML IJ SOLN: 0.2000 mg | INTRAMUSCULAR | Status: AC | PRN

## 2015-01-08 MED ORDER — FERROUS SULFATE 325 (65 FE) MG PO TABS
325.0000 mg | ORAL_TABLET | Freq: Two times a day (BID) | ORAL | Status: DC
  Administered 2015-01-09 (×2): 325 mg via ORAL
  Filled 2015-01-08 (×2): qty 1

## 2015-01-08 MED ORDER — ONDANSETRON HCL 4 MG/2ML IJ SOLN: 4.0000 mg | Freq: Four times a day (QID) | INTRAMUSCULAR | Status: DC | PRN

## 2015-01-08 MED ORDER — LANOLIN HYDROUS EX OINT: TOPICAL_OINTMENT | CUTANEOUS | Status: DC | PRN

## 2015-01-08 MED ORDER — MISOPROSTOL 200 MCG PO TABS
ORAL_TABLET | ORAL | Status: AC
  Administered 2015-01-08: 1000 ug via RECTAL
  Filled 2015-01-08: qty 5

## 2015-01-08 MED ORDER — DIBUCAINE 1 % RE OINT: 1.0000 "application " | TOPICAL_OINTMENT | RECTAL | Status: DC | PRN

## 2015-01-08 MED ORDER — DIPHENHYDRAMINE HCL 25 MG PO CAPS: 25.0000 mg | ORAL_CAPSULE | Freq: Four times a day (QID) | ORAL | Status: DC | PRN

## 2015-01-08 MED ORDER — OXYTOCIN 10 UNIT/ML IJ SOLN
INTRAMUSCULAR | Status: AC
  Administered 2015-01-08: 10 [IU] via INTRAMUSCULAR
  Filled 2015-01-08: qty 1

## 2015-01-08 MED ORDER — LIDOCAINE HCL (PF) 1 % IJ SOLN
30.0000 mL | INTRAMUSCULAR | Status: DC | PRN
  Filled 2015-01-08: qty 30

## 2015-01-08 MED ORDER — SIMETHICONE 80 MG PO CHEW: 80.0000 mg | CHEWABLE_TABLET | ORAL | Status: DC | PRN

## 2015-01-08 MED ORDER — LACTATED RINGERS IV SOLN
INTRAVENOUS | Status: DC
  Administered 2015-01-08 (×2): via INTRAVENOUS

## 2015-01-08 MED ORDER — OXYCODONE-ACETAMINOPHEN 5-325 MG PO TABS: 1.0000 | ORAL_TABLET | ORAL | Status: DC | PRN

## 2015-01-08 MED ORDER — CITRIC ACID-SODIUM CITRATE 334-500 MG/5ML PO SOLN: 30.0000 mL | ORAL | Status: DC | PRN

## 2015-01-08 MED ORDER — WITCH HAZEL-GLYCERIN EX PADS: 1.0000 "application " | MEDICATED_PAD | CUTANEOUS | Status: DC | PRN

## 2015-01-08 MED ORDER — SENNOSIDES-DOCUSATE SODIUM 8.6-50 MG PO TABS
2.0000 | ORAL_TABLET | ORAL | Status: DC
  Administered 2015-01-09: 2 via ORAL
  Filled 2015-01-08 (×2): qty 2

## 2015-01-08 MED ORDER — METHYLERGONOVINE MALEATE 0.2 MG/ML IJ SOLN
INTRAMUSCULAR | Status: AC
  Filled 2015-01-08: qty 1

## 2015-01-08 MED ORDER — ONDANSETRON HCL 4 MG/2ML IJ SOLN: 4.0000 mg | INTRAMUSCULAR | Status: DC | PRN

## 2015-01-08 MED ORDER — PRENATAL MULTIVITAMIN CH
1.0000 | ORAL_TABLET | Freq: Every day | ORAL | Status: DC
  Administered 2015-01-09: 1 via ORAL
  Filled 2015-01-08: qty 1

## 2015-01-08 MED ORDER — ONDANSETRON HCL 4 MG PO TABS: 4.0000 mg | ORAL_TABLET | ORAL | Status: DC | PRN

## 2015-01-08 MED ORDER — FLEET ENEMA 7-19 GM/118ML RE ENEM: 1.0000 | ENEMA | RECTAL | Status: DC | PRN

## 2015-01-08 MED ORDER — CEFAZOLIN SODIUM-DEXTROSE 2-3 GM-% IV SOLR
2.0000 g | Freq: Four times a day (QID) | INTRAVENOUS | Status: AC
  Administered 2015-01-08 – 2015-01-09 (×4): 2 g via INTRAVENOUS
  Filled 2015-01-08 (×4): qty 50

## 2015-01-08 MED ORDER — PROMETHAZINE HCL 25 MG/ML IJ SOLN
12.5000 mg | Freq: Four times a day (QID) | INTRAMUSCULAR | Status: DC | PRN
  Filled 2015-01-08: qty 1

## 2015-01-08 MED ORDER — IBUPROFEN 600 MG PO TABS
600.0000 mg | ORAL_TABLET | Freq: Four times a day (QID) | ORAL | Status: DC
  Administered 2015-01-08 – 2015-01-10 (×6): 600 mg via ORAL
  Filled 2015-01-08 (×6): qty 1

## 2015-01-08 MED ORDER — METHYLERGONOVINE MALEATE 0.2 MG PO TABS
0.2000 mg | ORAL_TABLET | ORAL | Status: AC | PRN
  Administered 2015-01-08 – 2015-01-09 (×6): 0.2 mg via ORAL
  Filled 2015-01-08 (×6): qty 1

## 2015-01-08 MED ORDER — OXYTOCIN 10 UNIT/ML IJ SOLN
10.0000 [IU] | Freq: Once | INTRAMUSCULAR | Status: AC
  Administered 2015-01-08: 10 [IU] via INTRAMUSCULAR

## 2015-01-08 MED ORDER — METHYLERGONOVINE MALEATE 0.2 MG/ML IJ SOLN
0.2000 mg | Freq: Once | INTRAMUSCULAR | Status: AC
  Administered 2015-01-08: 0.2 mg via INTRAMUSCULAR

## 2015-01-08 MED ORDER — ZOLPIDEM TARTRATE 5 MG PO TABS: 5.0000 mg | ORAL_TABLET | Freq: Every evening | ORAL | Status: DC | PRN

## 2015-01-08 MED ORDER — TETANUS-DIPHTH-ACELL PERTUSSIS 5-2.5-18.5 LF-MCG/0.5 IM SUSP: 0.5000 mL | Freq: Once | INTRAMUSCULAR | Status: DC

## 2015-01-08 MED ORDER — ACETAMINOPHEN 325 MG PO TABS: 650.0000 mg | ORAL_TABLET | ORAL | Status: DC | PRN

## 2015-01-08 MED ORDER — MISOPROSTOL 200 MCG PO TABS
1000.0000 ug | ORAL_TABLET | Freq: Once | ORAL | Status: AC
  Administered 2015-01-08: 1000 ug via RECTAL

## 2015-01-08 MED ORDER — LACTATED RINGERS IV SOLN
500.0000 mL | INTRAVENOUS | Status: DC | PRN
  Administered 2015-01-08: 1000 mL via INTRAVENOUS
  Administered 2015-01-08 (×2): 500 mL via INTRAVENOUS

## 2015-01-08 MED ORDER — INFLUENZA VAC SPLIT QUAD 0.5 ML IM SUSY: 0.5000 mL | PREFILLED_SYRINGE | INTRAMUSCULAR | Status: DC

## 2015-01-08 MED ORDER — OXYTOCIN 40 UNITS IN LACTATED RINGERS INFUSION - SIMPLE MED
62.5000 mL/h | INTRAVENOUS | Status: DC
  Filled 2015-01-08: qty 1000

## 2015-01-08 NOTE — Progress Notes (Signed)
Patient ID: Suzanne Flowers, female   DOB: 1991/02/09, 24 y.o.   MRN: [id number] Called to see pt because of heavy bleeding.  Pt was consented for EUA, possible D&C, Bakri balloon and hysterectomy Pt was given fentanyl Vulva and vagina had no lacerations There was a large clot at the cervical os that was removed with ring forceps.   I then did a manual exploration of the uterus and clot and retained placenta was removed The uterus then became firm EBL Pt will stay on methergine series and remain NPO for four hours Check hemoglobin in four hour Continue IVF Ancef for 24 hours to help prevent PP endometritis.

## 2015-01-08 NOTE — Plan of Care (Signed)
Problem: Phase II Progression Outcomes Goal: Other Phase II Outcomes/Goals Outcome: Completed/Met Date Met:  01/08/15 PPH stage 2 initiated

## 2015-01-08 NOTE — Progress Notes (Signed)
RN getting patient ready to move to [facility]. Pt dangled at bedside, and then proceeded to stand & turn to wheelchair. While doing these moves pt became dizzy & nauseous/vomiting. BP assessed & noted to be low. CNM had come to check on pt & acknowledged pts condition. CNM would like pt to be monitored on L&D for a while longer. Will continue to assess. IV fluid bolus initiated.

## 2015-01-08 NOTE — Plan of Care (Signed)
Problem: Discharge Progression Outcomes Goal: Tolerating diet Outcome: Completed/Met Date Met:  01/08/15 NPO     

## 2015-01-08 NOTE — MAU Note (Signed)
Spoke with Herbert Seta, BS charge RN.  Patient to be admitted to room 170.

## 2015-01-08 NOTE — Progress Notes (Addendum)
  Subjective: Remains in MAU due to census in L&D.  Coping well with UCs.  Husband at bedside.l  Objective: BP 121/73 mmHg  Pulse 98  Temp(Src) 97.7 F (36.5 C) (Oral)  Resp 18  LMP 04/25/2014      FHT: Category 1 at present--accels noted in most recent segment of tracing. UC:   irregular, every 4-7 minutes SVE:   Dilation: 4 Effacement (%): 70 Station: -1 Exam by:: Manfred Arch, CNM   Assessment:  Early labor Previous C/S, subsequent VBAC, desires TOLAC GBS negative SROM at 9am, clear fluid  Plan: Await room on [employer]. Report to [employer], CNM  Nigel Bridgeman CNM 01/08/2015, 3:57 PM

## 2015-01-08 NOTE — MAU Note (Signed)
Pt states here for LOF at 0900, clear fluid, [place], CNM at bedside.

## 2015-01-08 NOTE — H&P (Signed)
CALEE NUGENT is a 24 y.o. female, G3P2002 at 60 4/7weeks, presenting for SROM at 9am, clear fluid, with UCs increasing in strength and frequency since.  Was contracting last night, currently q 7-8 min.  Patient was in [place] (with her family) and drove back here with partner for labor care.  Will stay in [place] for a while after delivery, then move back to [place].  Denies HSV lesions or prodrome.  Hx previous C/S for FTP in first pregnancy, then successful VBAC 01/22/15.  Desires TOLAC, consent in prenatal records.  Had been told she was not a candidate for epidural due to "extensive back surgery" in 2006 for scoliosis. Did have spinal with first delivery by C/S without complications.  No meds during 2nd labor.  Patient Active Problem List   Diagnosis Date Noted  . 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 Scoliosis--rods in back.  01/08/2015  . Previous cesarean section--FTP 01/08/2015  . History of VBAC 2015 01/08/2015  . Short interval between pregnancies affecting pregnancy, antepartum 01/08/2015  . Late prenatal care 01/08/2015    History of present pregnancy: Patient entered care at 27 3/7 weeks--no care prior.   EDC of 01/25/15 was established by [place] at 26 weeks  Anatomy scan:  28 6/7 weeks, with normal findings and an anterior placenta.  EDC by [place] 01/25/15, EDC by LMP 01/11/15--EDC changed to [place] EDC.  Normal fluid and cervical length. Additional [place] evaluations:   29 2/7 weeks, for f/u:  EFW 3 lbs, 33.8%ile, EDC 01/24/15, c/w initial [place].  AFI 15.92, cervix WNL, breech, anterior placenta. Significant prenatal events:  Entered care at 27 weeks, with no previous care.  Declined genetic testing and glucola.  Desired VBAC, TOL consent signed at 34 weeks.  Seen for spotting in [place], with negative findings.  In process of relocating to [place] during pregnancy.  Started Valtrex at 34 weeks.   Last evaluation:  12/25/14--BP 110/60, 143 lbs.  Last VE 12/29/14 2 cm, 50%, vtx, -3.  OB History    Gravida Para  Term Preterm AB TAB SAB Ectopic Multiple Living   3 2 2       2     2014--Primary LTCS, 39 weeks, 16 hour labor, FTP, 6+4, female, Dr. Renaldo Fiddler, [facility].  Spinal OK for surgery, had been told no epidural due to "extensive back surgery" in past. 01/2014--VBAC, 39 weeks, 2 hour labor, 6+12, female, Dr. Arelia Sneddon, [facility]--no meds, rapid labor.  Past Medical History  Diagnosis Date  . Medical history non-contributory   . Scoliosis   . Spontaneous vaginal delivery 01/21/2014  . Pregnancy 01/21/2014   Past Surgical History  Procedure Laterality Date  . Back surgery    . Cesarean section N/A 01/18/2013    Procedure: Primary cesarean section with delivery of baby girl at 1826. Apgars 9/9.;  Surgeon: Zelphia Cairo, MD;  Location: [facility];  Service: Obstetrics;  Laterality: N/A;   Family History: family history includes Diabetes in her maternal grandmother, paternal grandfather, and paternal grandmother.   Social History:  reports that she has never smoked. She does not have any smokeless tobacco history on file. She reports that she does not drink alcohol or use illicit drugs.  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  Patient is a SAHM.     Prenatal Transfer Tool  Maternal Diabetes: No--declined testing Genetic Screening: Declined Maternal Ultrasounds/Referrals: Normal Fetal Ultrasounds or other Referrals:  None Maternal Substance Abuse:  No Significant Maternal Medications:  None Significant Maternal Lab Results: Lab values include: Group B Strep negative  TDAP NA Flu NA  ROS:  Leaking clear fluid, contractions, +FM.  No Known Allergies   Dilation: 4 Effacement (%): 70 Station: -1 Exam by:: Manfred Arch, CNM Last menstrual period 04/25/2014, currently breastfeeding.  Chest clear Heart RRR without murmur Abd gravid, NT, FH 38 cm Pelvic: As above, leaking clear fluid, no HSV lesions on vulva, vagina, or cervix.  Denies prodrome. Ext:  WNL  FHR: Category 2--decreased variability at times, no accels, no decels. UCs:  q 7-8 min, moderate  Prenatal labs: ABO, Rh: --/--/B POS, B POS (09/17 2100) Antibody: NEG (09/17 2100) Rubella:    RPR: NON REAC (09/17 2100)  HBsAg:   Neg HIV:   NR GBS:  Negative 12/29/14 Sickle cell/Hgb electrophoresis:  AA Pap:  WNL 10/25/14 GC:  Negative 10/15/14 Chlamydia:  Negative 10/15/14 Genetic screenings:  Declined Glucola:  Declined Other:   Hgb 9.8 at NOB     Assessment/Plan: IUP at 37 4/7 weeks SROM at 9am, clear fluid GBS negative Previous C/S, with subsequent VBAC 2015 Hx HSV--no recent/current lesions Late prenatal care Hx extensive back surgery--no epidural, OK for spinal Anemia   Plan: Admit to Birthing Suite per consult with Dr. Normand Sloop Routine [facility] orders Pain med prn--patient currently declines IV hydration TOLAC consent on chart  Dvontae Ruan, [place]ICK[place]NM, [place] 01/08/2015, 2:06 PM

## 2015-01-09 LAB — CBC
HCT: 21 % — ABNORMAL LOW (ref 36.0–46.0)
HEMATOCRIT: 19.4 % — AB (ref 36.0–46.0)
HEMOGLOBIN: 6.8 g/dL — AB (ref 12.0–15.0)
HEMOGLOBIN: 6.4 g/dL — AB (ref 12.0–15.0)
MCH: 25.3 pg — AB (ref 26.0–34.0)
MCH: 25.9 pg — AB (ref 26.0–34.0)
MCHC: 32.4 g/dL (ref 30.0–36.0)
MCHC: 33 g/dL (ref 30.0–36.0)
MCV: 78.5 fL (ref 78.0–100.0)
MCV: 78.1 fL (ref 78.0–100.0)
Platelets: 119 10*3/uL — ABNORMAL LOW (ref 150–400)
Platelets: 124 10*3/uL — ABNORMAL LOW (ref 150–400)
RBC: 2.69 MIL/uL — AB (ref 3.87–5.11)
RBC: 2.47 MIL/uL — AB (ref 3.87–5.11)
RDW: 15.5 % (ref 11.5–15.5)
RDW: 15.6 % — ABNORMAL HIGH (ref 11.5–15.5)
WBC: 15.4 10*3/uL — ABNORMAL HIGH (ref 4.0–10.5)
WBC: 17.4 10*3/uL — ABNORMAL HIGH (ref 4.0–10.5)

## 2015-01-09 LAB — RPR: RPR: NONREACTIVE

## 2015-01-09 LAB — PREPARE RBC (CROSSMATCH)

## 2015-01-09 LAB — HIV ANTIBODY (ROUTINE TESTING W REFLEX): HIV Screen 4th Generation wRfx: NONREACTIVE

## 2015-01-09 MED ORDER — ACETAMINOPHEN 325 MG PO TABS
650.0000 mg | ORAL_TABLET | Freq: Once | ORAL | Status: AC
  Administered 2015-01-09: 650 mg via ORAL
  Filled 2015-01-09: qty 2

## 2015-01-09 MED ORDER — SODIUM CHLORIDE 0.9 % IV SOLN
Freq: Once | INTRAVENOUS | Status: AC
  Administered 2015-01-09: 18:00:00 via INTRAVENOUS

## 2015-01-09 NOTE — Progress Notes (Signed)
This note also relates to the following rows which could not be included: Rate - Cannot attach notes to extension rows Line - Cannot attach notes to extension rows   Pt instructed to notify RN if develops chills or shakes, low back pain , itching or SOB after blood infusion started

## 2015-01-09 NOTE — Progress Notes (Signed)
Patient ID: Suzanne Flowers, female   DOB: 01-16-1991, 24 y.o.   MRN: [id number] Pt states she fees dizzy with standing and would like a blood transfusion.  Will order today

## 2015-01-09 NOTE — Lactation Note (Signed)
This note was copied from the chart of Suzanne Flowers. Lactation Consultation Note Initial visit at 24 hours of age.  MOM had pph with 1885 EBL.  No void close to 24 hours of age and peds encouraged mom to supplement until milk comes to volume.  MOm is just starting a blood transfusion. Discussed plan to feed on demand and hand express.  Hand pump given and mom is familiar with using with older children.  Medicine cup given to collect colostrum with hand expression to give to baby.  Encouraged mom to let us know if she is feeling up to doing some pumping with DEBP, mom prefers hand pump.  [facility] LC resources given and discussed.  Encouraged to feed with early cues on demand.  Early newborn behavior discussed. MBU RN at bedside aware of plan.   Mom to call for assist as needed.     Patient Name: Suzanne Flowers RUEAV'W Date: 01/09/2015 Reason for consult: Initial assessment   Maternal Data Has patient been taught Hand Expression?: Yes Does the patient have breastfeeding experience prior to this delivery?: Yes  Feeding Feeding Type: Breast Fed  LATCH Score/Interventions                      Lactation Tools Discussed/Used WIC Program: No Date initiated:: 01/09/15   Consult Status Consult Status: Follow-up Date: 01/10/15 Follow-up type: In-patient    Suzanne Flowers, Suzanne Flowers 01/09/2015, 6:26 PM

## 2015-01-09 NOTE — Progress Notes (Signed)
Made call to [facility] (CNM) regarding critical lab value.  Hemoglobin 6.8.  Patient not complaining about dizziness or SOB at this time.  Foley in place.  Advised patient to call if she needs to have pads changed.  Advised patient to call first before trying to get out of bed.  Patient agrees.  No new orders at this time. Will continue to monitor.

## 2015-01-09 NOTE — Progress Notes (Signed)
Suzanne Flowers  Post Partum Day 1:S/P SVD with Superficial BL PeriUrethral Lacerations and PPH with Manual Exploration  Subjective: Patient up ad lib, denies syncope or dizziness. Reports consuming regular diet without issues and denies N/V. Requests removal of foley catheter, but is unsure of how bleeding is.  Patient is breastfeeding.  Unsure of method for postpartum contraception.  Pain is being appropriately managed with use of ibuprofen.  Objective: Filed Vitals:   01/08/15 2225 01/08/15 2335 01/09/15 0335 01/09/15 0555  BP: 104/51 105/53 104/58 108/55  Pulse: 114 115 101 97  Temp: 99.5 F (37.5 C) 100 F (37.8 C) 98.4 F (36.9 C) 98.4 F (36.9 C)  TempSrc: Oral Oral Oral Oral  Resp: SpO2: 100% 100% 100%     Recent Labs  01/08/15 2350 01/09/15 0530  HGB 6.8* 6.4*  HCT 21.0* 19.4*    Physical Exam:  General: alert, cooperative and no distress Mood/Affect: Flat/Tired Lungs: clear to auscultation, no wheezes, rales or rhonchi, symmetric air entry.  Heart: normal rate and regular rhythm. Breast: breasts appear normal, no suspicious masses, no skin or nipple changes or axillary nodes. Abdomen:  + bowel sounds, Soft, Tender at fundus Uterine Fundus: firm, +1/U Lochia: appropriate Laceration: Not Examined Skin: Warm, Dry DVT Evaluation: No evidence of DVT seen on physical exam. No cords or calf tenderness. No significant calf/ankle edema.  Assessment S/P Vaginal Delivery-Day 1 S/P PPH Normal Involution BreastFeeding Asymptomatic Anemia  Plan: Will discontinue foley catheter after meal  Okay to shower Continue to maintain saline lock Methergine and Ancef x 24 hrs Continue current care Dr. N.Dillard to be updated on patient status   Suzanne Dawe LYNN, MSN, CNM 01/09/2015, 8:11 AM

## 2015-01-10 DIAGNOSIS — D649 Anemia, unspecified: Secondary | ICD-10-CM

## 2015-01-10 DIAGNOSIS — O34219 Maternal care for unspecified type scar from previous cesarean delivery: Secondary | ICD-10-CM | POA: Diagnosis not present

## 2015-01-10 LAB — CBC
HEMATOCRIT: 25.8 % — AB (ref 36.0–46.0)
HEMOGLOBIN: 8.5 g/dL — AB (ref 12.0–15.0)
MCH: 26 pg (ref 26.0–34.0)
MCHC: 32.9 g/dL (ref 30.0–36.0)
MCV: 78.9 fL (ref 78.0–100.0)
Platelets: 125 10*3/uL — ABNORMAL LOW (ref 150–400)
RBC: 3.27 MIL/uL — AB (ref 3.87–5.11)
RDW: 15.6 % — ABNORMAL HIGH (ref 11.5–15.5)
WBC: 12 10*3/uL — ABNORMAL HIGH (ref 4.0–10.5)

## 2015-01-10 NOTE — Discharge Summary (Signed)
  Vaginal Delivery Discharge Summary  Suzanne Flowers  DOB:    1991/03/15 MRN:    [id number] CSN:    [id number]  Date of admission:                  01/08/15  Date of discharge:                   01/10/15  Procedures this admission:   VBAC, manual exploration of uterus, blood transfusion  Date of Delivery: 01/08/15  Newborn Data:  Live born female  Birth Weight: 6 lb 7.4 oz (2930 g) APGAR: 7, 7  Home with mother. Name: Suzanne Flowers   History of Present Illness:  Ms. Suzanne Flowers is a 24 y.o. female, [phone], who presents at [redacted]w[redacted]d weeks gestation. The patient has been followed at [facility] of [facility]. She was admitted for onset of labor. Her pregnancy has been complicated by:  Patient Active Problem List   Diagnosis Date Noted  . VBAC (vaginal birth after Cesarean) 01/10/2015  . Postpartum hemorrhage 01/10/2015  . Anemia 01/10/2015  . 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 Scoliosis--rods in back.  01/08/2015  . Previous cesarean section--FTP 01/08/2015  . History of VBAC 2015 01/08/2015  . Short interval between pregnancies affecting pregnancy, antepartum 01/08/2015  . Late prenatal care 01/08/2015     Hospital Course:   Admitting Dx:  IUP at 37 4/7 weeks, active labor, previous C/S, desires TOL GBS Status:  Negative Delivering Clinician: Venus Standard, CNM Lacerations/MLE: Superficial bilateral periurethral lacerations Complications: PP hemorrhage, with 2000 cc blood loss.  Manual exploration of uterus.  Blood transfusion x 2 units.  Ancef x 24 hours.  Intrapartum Procedures: VBAC, spontaneous vaginal delivery and uterine exploration Postpartum Procedures: transfusion 2 u PRBCs on day 1 Complications-Operative and Postpartum: Immediate pp hemorrhage, symptomatic anemia  Discharge Diagnoses: Term Pregnancy-delivered and previous C/S, VBAC, PP hemorrhage, anemia related to blood  loss  Feeding:  breast  Contraception:  Considering Nexplanon--information home with patient.  Hemoglobin Results:  CBC Latest Ref Rng 01/10/2015 01/09/2015 01/08/2015  WBC 4.0 - 10.5 K/uL 12.0(H) 15.4(H) 17.4(H)  Hemoglobin 12.0 - 15.0 g/dL 1.3(Y) 6.4(LL) 6.8(LL)  Hematocrit 36.0 - 46.0 % 25.8(L) 19.4(L) 21.0(L)  Platelets 150 - 400 K/uL 125(L) 119(L) 124(L)    Discharge Physical Exam:   General: alert Lochia: appropriate Uterine Fundus: firm Incision: healing well DVT Evaluation: No evidence of DVT seen on physical exam. Negative Homan's sign.   Discharge Information:  Activity:           pelvic rest Diet:                routine Medications: OTC Ibuprophen and continue Fe Condition:      stable Instructions:  Routine pp instructions   Discharge to: home  Follow-up Information    Follow up with [facility]. Schedule an appointment as soon as possible for a visit in 6 weeks.   Specialty:  Obstetrics and Gynecology   Why:  Call for any questions or concerns.   Contact information:   [address] [place] [id number] [phone]       Nigel Bridgeman CNM 01/10/2015 9:45 AM

## 2015-01-10 NOTE — Discharge Instructions (Signed)
Postpartum Care After Vaginal Delivery °After you deliver your newborn (postpartum period), the usual stay in the hospital is 24-72 hours. If there were problems with your labor or delivery, or if you have other medical problems, you might be in the hospital longer.  °While you are in the hospital, you will receive help and instructions on how to care for yourself and your newborn during the postpartum period.  °While you are in the hospital: °· Be sure to tell your nurses if you have pain or discomfort, as well as where you feel the pain and what makes the pain worse. °· If you had an incision made near your vagina (episiotomy) or if you had some tearing during delivery, the nurses may put ice packs on your episiotomy or tear. The ice packs may help to reduce the pain and swelling. °· If you are breastfeeding, you may feel uncomfortable contractions of your uterus for a couple of weeks. This is normal. The contractions help your uterus get back to normal size. °· It is normal to have some bleeding after delivery. °¨ For the first 1-3 days after delivery, the flow is red and the amount may be similar to a period. °¨ It is common for the flow to start and stop. °¨ In the first few days, you may pass some small clots. Let your nurses know if you begin to pass large clots or your flow increases. °¨ Do not  flush blood clots down the toilet before having the nurse look at them. °¨ During the next 3-10 days after delivery, your flow should become more watery and pink or brown-tinged in color. °¨ Ten to fourteen days after delivery, your flow should be a small amount of yellowish-white discharge. °¨ The amount of your flow will decrease over the first few weeks after delivery. Your flow may stop in 6-8 weeks. Most women have had their flow stop by 12 weeks after delivery. °· You should change your sanitary pads frequently. °· Wash your hands thoroughly with soap and water for at least 20 seconds after changing pads, using  the toilet, or before holding or feeding your newborn. °· You should feel like you need to empty your bladder within the first 6-8 hours after delivery. °· In case you become weak, lightheaded, or faint, call your nurse before you get out of bed for the first time and before you take a shower for the first time. °· Within the first few days after delivery, your breasts may begin to feel tender and full. This is called engorgement. Breast tenderness usually goes away within 48-72 hours after engorgement occurs. You may also notice milk leaking from your breasts. If you are not breastfeeding, do not stimulate your breasts. Breast stimulation can make your breasts produce more milk. °· Spending as much time as possible with your newborn is very important. During this time, you and your newborn can feel close and get to know each other. Having your newborn stay in your room (rooming in) will help to strengthen the bond with your newborn.  It will give you time to get to know your newborn and become comfortable caring for your newborn. °· Your hormones change after delivery. Sometimes the hormone changes can temporarily cause you to feel sad or tearful. These feelings should not last more than a few days. If these feelings last longer than that, you should talk to your caregiver. °· If desired, talk to your caregiver about methods of family planning or contraception. °·   Talk to your caregiver about immunizations. Your caregiver may want you to have the following immunizations before leaving the hospital:  Tetanus, diphtheria, and pertussis (Tdap) or tetanus and diphtheria (Td) immunization. It is very important that you and your family (including grandparents) or others caring for your newborn are up-to-date with the Tdap or Td immunizations. The Tdap or Td immunization can help protect your newborn from getting ill.  Rubella immunization.  Varicella (chickenpox) immunization.  Influenza immunization. You should  receive this annual immunization if you did not receive the immunization during your pregnancy. Document Released: 02/18/2007 Document Revised: 01/16/2012 Document Reviewed: 12/19/2011 [facility] Patient Information 2015 [place], [place]. This information is not intended to replace advice given to you by your health care provider. Make sure you discuss any questions you have with your health care provider.  Etonogestrel implant--NEXPLANON What is this medicine? ETONOGESTREL (et oh noe JES trel) is a contraceptive (birth control) device. It is used to prevent pregnancy. It can be used for up to 3 years. This medicine may be used for other purposes; ask your health care provider or pharmacist if you have questions. COMMON BRAND NAME(S): Implanon, Nexplanon What should I tell my health care provider before I take this medicine? They need to know if you have any of these conditions: -abnormal vaginal bleeding -blood vessel disease or blood clots -cancer of the breast, cervix, or liver -depression -diabetes -gallbladder disease -headaches -heart disease or recent heart attack -high blood pressure -high cholesterol -kidney disease -liver disease -renal disease -seizures -tobacco smoker -an unusual or allergic reaction to etonogestrel, other hormones, anesthetics or antiseptics, medicines, foods, dyes, or preservatives -pregnant or trying to get pregnant -breast-feeding How should I use this medicine? This device is inserted just under the skin on the inner side of your upper arm by a health care professional. Talk to your pediatrician regarding the use of this medicine in children. Special care may be needed. Overdosage: If you think you've taken too much of this medicine contact a poison control center or emergency room at once. Overdosage: If you think you have taken too much of this medicine contact a poison control center or emergency room at once. NOTE: This medicine is only for you. Do  not share this medicine with others. What if I miss a dose? This does not apply. What may interact with this medicine? Do not take this medicine with any of the following medications: -amprenavir -bosentan -fosamprenavir This medicine may also interact with the following medications: -barbiturate medicines for inducing sleep or treating seizures -certain medicines for fungal infections like ketoconazole and itraconazole -griseofulvin -medicines to treat seizures like carbamazepine, felbamate, oxcarbazepine, phenytoin, topiramate -modafinil -phenylbutazone -rifampin -some medicines to treat HIV infection like atazanavir, indinavir, lopinavir, nelfinavir, tipranavir, ritonavir -St. John's wort This list may not describe all possible interactions. Give your health care provider a list of all the medicines, herbs, non-prescription drugs, or dietary supplements you use. Also tell them if you smoke, drink alcohol, or use illegal drugs. Some items may interact with your medicine. What should I watch for while using this medicine? This product does not protect you against HIV infection (AIDS) or other sexually transmitted diseases. You should be able to feel the implant by pressing your fingertips over the skin where it was inserted. Tell your doctor if you cannot feel the implant. What side effects may I notice from receiving this medicine? Side effects that you should report to your doctor or health care professional as soon as  possible: -allergic reactions like skin rash, itching or hives, swelling of the face, lips, or tongue -breast lumps -changes in vision -confusion, trouble speaking or understanding -dark urine -depressed mood -general ill feeling or flu-like symptoms -light-colored stools -loss of appetite, nausea -right upper belly pain -severe headaches -severe pain, swelling, or tenderness in the abdomen -shortness of breath, chest pain, swelling in a leg -signs of  pregnancy -sudden numbness or weakness of the face, arm or leg -trouble walking, dizziness, loss of balance or coordination -unusual vaginal bleeding, discharge -unusually weak or tired -yellowing of the eyes or skin Side effects that usually do not require medical attention (Report these to your doctor or health care professional if they continue or are bothersome.): -acne -breast pain -changes in weight -cough -fever or chills -headache -irregular menstrual bleeding -itching, burning, and vaginal discharge -pain or difficulty passing urine -sore throat This list may not describe all possible side effects. Call your doctor for medical advice about side effects. You may report side effects to FDA at 1-800-FDA-1088. Where should I keep my medicine? This drug is given in a hospital or clinic and will not be stored at home. NOTE: This sheet is a summary. It may not cover all possible information. If you have questions about this medicine, talk to your doctor, pharmacist, or health care provider.  2015, [employer]. (2011-10-29 15:37:45)

## 2015-01-10 NOTE — Lactation Note (Signed)
This note was copied from the chart of Suzanne Laberta Wilbon. Lactation Consultation Note; Experienced BF mom reports baby is latching well with no pain. Has manual pump in room but states she has not used it- has just been nursing baby. No questions at present. To call prn  Patient Name: Suzanne Flowers [id number] Date: 01/10/2015 Reason for consult: Follow-up assessment   Maternal Data Formula Feeding for Exclusion: No Does the patient have breastfeeding experience prior to this delivery?: Yes  Feeding    LATCH Score/Interventions                      Lactation Tools Discussed/Used     Consult Status Consult Status: Complete    Pamelia Hoit 01/10/2015, 9:56 AM

## 2015-01-11 LAB — TYPE AND SCREEN
ABO/RH(D): B POS
Antibody Screen: NEGATIVE
UNIT DIVISION: 0
UNIT DIVISION: 0

## 2016-02-13 ENCOUNTER — Encounter: Payer: Self-pay | Admitting: Emergency Medicine

## 2016-02-13 ENCOUNTER — Emergency Department (HOSPITAL_COMMUNITY)
Admission: EM | Admit: 2016-02-13 | Discharge: 2016-02-14 | Disposition: A | Discharge status: Other Acute Inpatient Hospital | Payer: No Typology Code available for payment source | Attending: Emergency Medicine | Admitting: Emergency Medicine

## 2016-02-13 DIAGNOSIS — Z79899 Other long term (current) drug therapy: Secondary | ICD-10-CM | POA: Diagnosis not present

## 2016-02-13 DIAGNOSIS — F322 Major depressive disorder, single episode, severe without psychotic features: Secondary | ICD-10-CM

## 2016-02-13 DIAGNOSIS — T1491XA Suicide attempt, initial encounter: Secondary | ICD-10-CM | POA: Diagnosis present

## 2016-02-13 DIAGNOSIS — Z5181 Encounter for therapeutic drug level monitoring: Secondary | ICD-10-CM | POA: Diagnosis not present

## 2016-02-13 DIAGNOSIS — Y999 Unspecified external cause status: Secondary | ICD-10-CM | POA: Diagnosis not present

## 2016-02-13 DIAGNOSIS — R45851 Suicidal ideations: Secondary | ICD-10-CM

## 2016-02-13 DIAGNOSIS — F329 Major depressive disorder, single episode, unspecified: Secondary | ICD-10-CM | POA: Diagnosis present

## 2016-02-13 DIAGNOSIS — T402X2A Poisoning by other opioids, intentional self-harm, initial encounter: Secondary | ICD-10-CM | POA: Insufficient documentation

## 2016-02-13 DIAGNOSIS — X838XXA Intentional self-harm by other specified means, initial encounter: Secondary | ICD-10-CM | POA: Insufficient documentation

## 2016-02-13 DIAGNOSIS — T50902A Poisoning by unspecified drugs, medicaments and biological substances, intentional self-harm, initial encounter: Secondary | ICD-10-CM

## 2016-02-13 DIAGNOSIS — Y929 Unspecified place or not applicable: Secondary | ICD-10-CM | POA: Diagnosis not present

## 2016-02-13 DIAGNOSIS — Y9389 Activity, other specified: Secondary | ICD-10-CM | POA: Insufficient documentation

## 2016-02-13 DIAGNOSIS — Z833 Family history of diabetes mellitus: Secondary | ICD-10-CM | POA: Diagnosis not present

## 2016-02-13 LAB — COMPREHENSIVE METABOLIC PANEL
ALBUMIN: 4.5 g/dL (ref 3.5–5.0)
ALT: 14 U/L (ref 14–54)
AST: 17 U/L (ref 15–41)
Alkaline Phosphatase: 39 U/L (ref 38–126)
Anion gap: 5 (ref 5–15)
BUN: 11 mg/dL (ref 6–20)
CHLORIDE: 107 mmol/L (ref 101–111)
CO2: 24 mmol/L (ref 22–32)
CREATININE: 0.71 mg/dL (ref 0.44–1.00)
Calcium: 9 mg/dL (ref 8.9–10.3)
GFR calc Af Amer: >60 mL/min (ref >60)
GFR calc non Af Amer: >60 mL/min (ref >60)
Glucose, Bld: 96 mg/dL (ref 65–99)
Potassium: 3.3 mmol/L — ABNORMAL LOW (ref 3.5–5.1)
SODIUM: 136 mmol/L (ref 135–145)
Total Bilirubin: 0.6 mg/dL (ref 0.3–1.2)
Total Protein: 8.3 g/dL — ABNORMAL HIGH (ref 6.5–8.1)

## 2016-02-13 LAB — RAPID URINE DRUG SCREEN, HOSP PERFORMED
AMPHETAMINES: NOT DETECTED
Barbiturates: NOT DETECTED
Benzodiazepines: NOT DETECTED
Cocaine: NOT DETECTED
OPIATES: NOT DETECTED
TETRAHYDROCANNABINOL: NOT DETECTED

## 2016-02-13 LAB — CBC
HCT: 36.9 % (ref 36.0–46.0)
HEMOGLOBIN: 11.8 g/dL — AB (ref 12.0–15.0)
MCH: 25.3 pg — AB (ref 26.0–34.0)
MCHC: 32 g/dL (ref 30.0–36.0)
MCV: 79.2 fL (ref 78.0–100.0)
Platelets: 197 10*3/uL (ref 150–400)
RBC: 4.66 MIL/uL (ref 3.87–5.11)
RDW: 14.2 % (ref 11.5–15.5)
WBC: 3.8 10*3/uL — ABNORMAL LOW (ref 4.0–10.5)

## 2016-02-13 LAB — SALICYLATE LEVEL

## 2016-02-13 LAB — I-STAT BETA HCG BLOOD, ED (MC, WL, AP ONLY): I-stat hCG, quantitative: <5 m[IU]/mL (ref <5)

## 2016-02-13 LAB — ACETAMINOPHEN LEVEL
Acetaminophen (Tylenol), Serum: 11 ug/mL (ref 10–30)
Acetaminophen (Tylenol), Serum: 17 ug/mL (ref 10–30)

## 2016-02-13 LAB — ETHANOL: Alcohol, Ethyl (B): <5 mg/dL (ref <5)

## 2016-02-13 MED ORDER — LORAZEPAM 1 MG PO TABS: 1.0000 mg | ORAL_TABLET | Freq: Three times a day (TID) | ORAL | Status: DC | PRN

## 2016-02-13 MED ORDER — ONDANSETRON HCL 4 MG PO TABS: 4.0000 mg | ORAL_TABLET | Freq: Three times a day (TID) | ORAL | Status: DC | PRN

## 2016-02-13 MED ORDER — ACETAMINOPHEN 325 MG PO TABS: 650.0000 mg | ORAL_TABLET | ORAL | Status: DC | PRN

## 2016-02-13 MED ORDER — IBUPROFEN 200 MG PO TABS: 600.0000 mg | ORAL_TABLET | Freq: Three times a day (TID) | ORAL | Status: DC | PRN

## 2016-02-13 MED ORDER — POTASSIUM CHLORIDE CRYS ER 20 MEQ PO TBCR
40.0000 meq | EXTENDED_RELEASE_TABLET | Freq: Once | ORAL | Status: AC
  Administered 2016-02-13: 40 meq via ORAL
  Filled 2016-02-13: qty 2

## 2016-02-13 NOTE — ED Notes (Signed)
Patient cleared by poison control at this time.  

## 2016-02-13 NOTE — ED Triage Notes (Signed)
Per [employer] pt comes in after bringing her children to their father's workplace and stated that she 'just couldn't do it anymore' and left the children there. Pt was found by [employer] and admitted to taking 6 oxycodone 5-325 in an attempt to harm herself. Alert and oriented.

## 2016-02-13 NOTE — ED Notes (Signed)
TTS at bedside. 

## 2016-02-13 NOTE — ED Notes (Signed)
ED PA at bedside

## 2016-02-13 NOTE — ED Notes (Signed)
Pt's medication counted and verified with Tiffany, Charge Nurse; medication delivered to pharmacy

## 2016-02-13 NOTE — BH Assessment (Signed)
Tele Assessment Note   Suzanne Flowers is an 25 y.o. female.  -Clinician reviewed note by Suzanne Crumble, PA.  Suzanne Flowers is a 25 y.o. female presents to emergency department complaining of suicidal thoughts and depression. Patient reports generalized stress, she states she has 3 children under age of 24. She reports that she has been thinking about hurting herself for a long time. She states that she has had thoughts about shooting herself in the past.  was found by police crying. Patient admitted to taking 6 oxycodone 5/325 mg tablets about an hour and a half ago prior to the ED arrival. Patient states that she admitted taking this medicine order to harm herself. Patient denies any specific events that would've triggered this. She denies drugs or alcohol. She denies any other medical problems.  Patient has three children with the same man.  All three children are under age 35.  Suzanne Flowers is <6 months old.  Patient lives with her father in [place].  Babies' father lives & works in [place].  Today patient was upset about the fact that babies' daddy told her yesterday (10/08) that he was going to start a relationship with another woman.  Patient was upset today and took the children to babies daddy's workplace and told him she "could not do it anymore."  Patient says that she took six oxycodone that she had left over from the c-section at last birth.  Patient was found by police and brought in.  Babies' daddy took the children to patient's father's home.    Patient says that it was her intention to kill herself.  She says that babies' daddy has flirted with other women before and they have gotten back together.  She is tearful that he may want to leave her and the children and make a life with someone else.    Patient said that 3-4 months ago she was going to cut her wrists to kill herself but "it was too painful."  She says she was very stressed out and "I just got over it."  Patient  decided then that if she was going to try to kill herself again, she would overdose, which is what she did today.  Patient denies any HI or A/V hallucinations.  Denies any use of ETOH or illicit drugs.  Patient has no prior inpatient or outpatient care experience.    Patient said she was willing to come in for inpatient care initially.  She then called her father and wanted him to come to pick her up.  Patient was informed that it was better to be voluntary to a facility than to have to be involuntary.    -Clinician discussed patient care with Suzanne Sievert, PA who recommends inpatient care for her.  [facility] has no appropriate beds at this time.  TTS to seek placement.     Diagnosis: MDD single episode, severe  Past Medical History:  Past Medical History:  Diagnosis Date  . Medical history non-contributory   . Pregnancy 01/21/2014  . Scoliosis   . Spontaneous vaginal delivery 01/21/2014    Past Surgical History:  Procedure Laterality Date  . BACK SURGERY    . CESAREAN SECTION N/A 01/18/2013   Procedure: Primary cesarean section with delivery of baby girl at 1826. Apgars 9/9.;  Surgeon: Zelphia Cairo, MD;  Location: [facility];  Service: Obstetrics;  Laterality: N/A;    Family History:  Family History  Problem Relation Age of Onset  . Diabetes Maternal Grandmother   .  Diabetes Paternal Grandmother   . Diabetes Paternal Grandfather     Social History:  reports that she has never smoked. She has never used smokeless tobacco. She reports that she does not drink alcohol or use drugs.  Additional Social History:  Alcohol / Drug Use Pain Medications: Pt had some oxycodone which she attempted to overdose on. Prescriptions: See PTA medication list Over the Counter: See PTA medication list History of alcohol / drug use?: No history of alcohol / drug abuse  CIWA: CIWA-Ar BP: 95/57 Pulse Rate: 67 COWS:    PATIENT STRENGTHS: (choose at least two) Ability for insight Average or above  average intelligence Communication skills Supportive family/friends  Allergies: No Known Allergies  Home Medications:  (Not in a hospital admission)  OB/GYN Status:  Patient's last menstrual period was 02/09/2016 (approximate).  General Assessment Data [facility]: [facility] TTS Assessment: In system Is this a Tele or Face-to-Face Assessment?: Face-to-Face Is this an Initial Assessment or a Re-assessment for this encounter?: Initial Assessment Marital status: Single Is patient pregnant?: No (Three children under age 713.) Pregnancy Status: No Living Arrangements: Parent (Pt lives with her father.  ) Can pt return to current living arrangement?: Yes Admission Status: Voluntary Is patient capable of signing voluntary admission?: Yes Referral Source: Other Insurance type: [facility] [employer]     Crisis Care Plan Living Arrangements: Parent (Pt lives with her father.  ) Name of Psychiatrist: None Name of Therapist: None  Education Status Is patient currently in school?: Yes Highest grade of school patient has completed: Some college.  Trying to complete a BS degre Name of school: [employer]  Risk to self with the past 6 months Suicidal Ideation: Yes-Currently Present Has patient been a risk to self within the past 6 months prior to admission? : Yes Suicidal Intent: Yes-Currently Present Has patient had any suicidal intent within the past 6 months prior to admission? : Yes Is patient at risk for suicide?: Yes Suicidal Plan?: Yes-Currently Present Has patient had any suicidal plan within the past 6 months prior to admission? : Yes Specify Current Suicidal Plan: Overdose on oxycodone Access to Means: Yes Specify Access to Suicidal Means: Medications What has been your use of drugs/alcohol within the last 12 months?: Pt denies Previous Attempts/Gestures: Yes How many times?: 1 Other Self Harm Risks: None Triggers for Past Attempts: Spouse contact (Baby father) Intentional Self Injurious  Behavior: None Family Suicide History: No Recent stressful life event(s): Loss (Comment), Turmoil (Comment) (Father of children leaving the relationship) Persecutory voices/beliefs?: No Depression: Yes Depression Symptoms: Despondent, Tearfulness, Isolating, Loss of interest in usual pleasures, Feeling worthless/self pity Substance abuse history and/or treatment for substance abuse?: No Suicide prevention information given to non-admitted patients: Not applicable  Risk to Others within the past 6 months Homicidal Ideation: No Does patient have any lifetime risk of violence toward others beyond the six months prior to admission? : No Thoughts of Harm to Others: No Current Homicidal Intent: No Current Homicidal Plan: No Access to Homicidal Means: No Identified Victim: No one History of harm to others?: No Assessment of Violence: None Noted Violent Behavior Description: None reported Does patient have access to weapons?: No Criminal Charges Pending?: No Does patient have a court date: No Is patient on probation?: No  Psychosis Hallucinations: None noted Delusions: None noted  Mental Status Report Appearance/Hygiene: Disheveled, In scrubs Eye Contact: Good Motor Activity: Freedom of movement, Unremarkable Speech: Logical/coherent, Soft Level of Consciousness: Alert Mood: Depressed, Helpless, Despair Affect: Depressed Anxiety  Level: Moderate Thought Processes: Coherent, Relevant Judgement: Unimpaired Orientation: Appropriate for developmental age Obsessive Compulsive Thoughts/Behaviors: None  Cognitive Functioning Concentration: Normal Memory: Recent Intact, Remote Intact IQ: Average Insight: Poor Impulse Control: Poor Appetite: Fair Weight Loss: 0 Weight Gain: 0 Sleep: No Change Total Hours of Sleep: 6 Vegetative Symptoms: None  ADLScreening [facility] Assessment Services) Patient's cognitive ability adequate to safely complete daily activities?: Yes Patient able to  express need for assistance with ADLs?: Yes Independently performs ADLs?: Yes (appropriate for developmental age)  Prior Inpatient Therapy Prior Inpatient Therapy: No Prior Therapy Dates: N/A Prior Therapy Facilty/Provider(s): N/A Reason for Treatment: N/A  Prior Outpatient Therapy Prior Outpatient Therapy: No Prior Therapy Dates: N/a Prior Therapy Facilty/Provider(s): N/A Reason for Treatment: N/A Does patient have an ACCT team?: No Does patient have Intensive In-House Services?  : No Does patient have [facility] services? : No Does patient have P4CC services?: No  ADL Screening (condition at time of admission) Patient's cognitive ability adequate to safely complete daily activities?: Yes Is the patient deaf or have difficulty hearing?: No Does the patient have difficulty seeing, even when wearing glasses/contacts?: No Does the patient have difficulty concentrating, remembering, or making decisions?: No Patient able to express need for assistance with ADLs?: Yes Does the patient have difficulty dressing or bathing?: No Independently performs ADLs?: Yes (appropriate for developmental age) Does the patient have difficulty walking or climbing stairs?: No Weakness of Legs: None Weakness of Arms/Hands: None       Abuse/Neglect Assessment (Assessment to be complete while patient is alone) Physical Abuse: Denies Verbal Abuse: Denies Sexual Abuse: Denies Exploitation of patient/patient's resources: Denies Self-Neglect: Denies     Merchant navy officer (For Healthcare) Does patient have an advance directive?: No Would patient like information on creating an advanced directive?: No - patient declined information    Additional Information 1:1 In Past 12 Months?: No CIRT Risk: No Elopement Risk: No Does patient have medical clearance?: Yes     Disposition:  Disposition Initial Assessment Completed for this Encounter: Yes Disposition of Patient: Inpatient treatment program,  Referred to Type of inpatient treatment program: Adult Patient referred to: Other (Comment) (To be reviewed by psychiatry in AM)  Beatriz Stallion Ray 02/13/2016 10:37 PM

## 2016-02-13 NOTE — ED Notes (Signed)
Counselor unable to assess pt because pt would not speak with him. Counselor requests that TTS be put back [facility] to see if pt will be willing to speak then.

## 2016-02-13 NOTE — ED Notes (Signed)
Sitter requested but none available at this time. Pt is within view of charge RN desk.

## 2016-02-13 NOTE — ED Notes (Signed)
Patient is with drawn at this time.Patient states "I just wan to rest". Writer will allow patient her personal space at this time. Encouragement and support provided and safety maintain. Q 15 min safety checks in place and video monitoring.

## 2016-02-13 NOTE — ED Provider Notes (Signed)
[facility]-EM[facility]ENCY DEPT Provider Note   CSN: [id number] Arrival date & time: 02/13/16  1545     History   Chief Complaint Chief Complaint  Patient presents with  . Drug Overdose    HPI KAMREN HESKETT is a 25 y.o. female.  HPI CARLYON NOLASCO is a 25 y.o. female presents to emergency department complaining of suicidal thoughts and depression. Patient reports generalized stress, she states she has 3 children under age of 52. She reports that she has been thinking about hurting herself for a long time. She states that she has had thoughts about shooting herself in the past. Today she took her children to her father's, told him that "I just can't do it anymore," and was found by police crying. Patient admitted to taking 6 oxycodone 5/325 mg tablets about an hour and a half ago prior to the ED arrival. Patient states that she admitted taking this medicine order to harm herself. Patient denies any specific events that would've triggered this. She denies drugs or alcohol. She denies any other medical problems.  Past Medical History:  Diagnosis Date  . Medical history non-contributory   . Pregnancy 01/21/2014  . Scoliosis   . Spontaneous vaginal delivery 01/21/2014    Patient Active Problem List   Diagnosis Date Noted  . VBAC (vaginal birth after Cesarean) 01/10/2015  . Postpartum hemorrhage 01/10/2015  . Anemia 01/10/2015  . 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 Scoliosis--rods in back.  01/08/2015  . Previous cesarean section--FTP 01/08/2015  . History of VBAC 2015 01/08/2015  . Short interval between pregnancies affecting pregnancy, antepartum 01/08/2015  . Late prenatal care 01/08/2015    Past Surgical History:  Procedure Laterality Date  . BACK SURGERY    . CESAREAN SECTION N/A 01/18/2013   Procedure: Primary cesarean section with delivery of baby girl at 1826. Apgars 9/9.;  Surgeon: Zelphia Cairo, MD;  Location: [facility];  Service: Obstetrics;  Laterality: N/A;    OB History    Gravida Para Term Preterm AB Living   3 3 3     3    SAB TAB Ectopic Multiple Live Births         0 3       Home Medications    Prior to Admission medications   Medication Sig Start Date End Date Taking? Authorizing Provider  OVER THE COUNTER MEDICATION Take 1 tablet by mouth daily. Over the counter 35 mg iron tablet    Historical Provider, MD    Family History Family History  Problem Relation Age of Onset  . Diabetes Maternal Grandmother   . Diabetes Paternal Grandmother   . Diabetes Paternal Grandfather     Social History Social History  Substance Use Topics  . Smoking status: Never Smoker  . Smokeless tobacco: Never Used  . Alcohol use No     Allergies   Review of patient's allergies indicates no known allergies.   Review of Systems Review of Systems  Constitutional: Negative for chills and fever.  Respiratory: Negative for cough, chest tightness and shortness of breath.   Cardiovascular: Negative for chest pain, palpitations and leg swelling.  Gastrointestinal: Negative for abdominal pain, diarrhea, nausea and vomiting.  Genitourinary: Negative for dysuria, flank pain, pelvic pain, vaginal bleeding, vaginal discharge and vaginal pain.  Musculoskeletal: Negative for arthralgias, myalgias, neck pain and neck stiffness.  Skin: Negative for rash.  Neurological: Negative for dizziness, weakness and headaches.  Psychiatric/Behavioral: Positive for dysphoric mood, self-injury and suicidal ideas. The patient is nervous/anxious.  All other systems reviewed and are negative.    Physical Exam Updated Vital Signs There were no vitals taken for this visit.  Physical Exam  Constitutional: She appears well-developed and well-nourished. No distress.  HENT:  Head: Normocephalic.  Eyes: Conjunctivae are normal.  Neck: Neck supple.  Cardiovascular: Normal rate, regular rhythm and normal heart sounds.   Pulmonary/Chest: Effort normal and breath sounds normal. No  respiratory distress. She has no wheezes. She has no rales.  Abdominal: Soft. Bowel sounds are normal. She exhibits no distension. There is no tenderness. There is no rebound.  Musculoskeletal: She exhibits no edema.  Neurological: She is alert.  Skin: Skin is warm and dry.  Psychiatric: She is withdrawn. She exhibits a depressed mood. She expresses suicidal ideation. She expresses suicidal plans.  Nursing note and vitals reviewed.    ED Treatments / Results  Labs (all labs ordered are listed, but only abnormal results are displayed) Labs Reviewed  COMPREHENSIVE METABOLIC PANEL  ETHANOL  SALICYLATE LEVEL  ACETAMINOPHEN LEVEL  CBC  URINE RAPID DRUG SCREEN, HOSP PERFORMED  CBG MONITORING, ED  I-STAT BETA HCG BLOOD, ED ([facility], [facility], [facility] ONLY)    EKG  EKG Interpretation None       Radiology No results found.  Procedures Procedures (including critical care time)  Medications Ordered in ED Medications - No data to display   Initial Impression / Assessment and Plan / ED Course  I have reviewed the triage vital signs and the nursing notes.  Pertinent labs & imaging results that were available during my care of the patient were reviewed by me and considered in my medical decision making (see chart for details).  Clinical Course   4:08 PM Patient seen and examined. Patient is here after taking 5-6 Percocet 5-325 mg tablets in order to harm herself. She is tearful, otherwise in no distress. No medical complaints. Will check labs, EKG, monitor. IV, RN, spoke with poison control, recommended Tylenol level, EKG, monitor.  5:28 PM TTS attempted to assess patient. Patient would not answer his questions. We'll try again once patient is completely medically cleared and moved back to the sappu  8:31 PM For our Tylenol level is 17. Patient is medically cleared. Patient will be transferred to psych ED for reassessment by TTS. Final Clinical Impressions(s) / ED Diagnoses   Final  diagnoses:  Intentional drug overdose, initial encounter [facility])  Suicidal ideation    New Prescriptions New Prescriptions   No medications on file     Jaynie Crumbleatyana Cherylann Hobday, Cordelia Poche-C 02/13/16 2032    Gerhard Munchobert Lockwood, MD 02/14/16 2050

## 2016-02-13 NOTE — BH Assessment (Signed)
[facility] Assessment Progress Note Patient refused to speak to this writer, consult was D/Ced and will be put back in later this date.

## 2016-02-14 ENCOUNTER — Encounter (HOSPITAL_COMMUNITY): Payer: Self-pay | Admitting: *Deleted

## 2016-02-14 ENCOUNTER — Inpatient Hospital Stay (HOSPITAL_COMMUNITY)
Admission: AD | Admit: 2016-02-14 | Discharge: 2016-02-17 | DRG: 885 | Disposition: A | Discharge status: Home / Self Care | Payer: 59 | Attending: Psychiatry | Admitting: Psychiatry

## 2016-02-14 ENCOUNTER — Encounter (HOSPITAL_COMMUNITY): Payer: Self-pay

## 2016-02-14 DIAGNOSIS — Z833 Family history of diabetes mellitus: Secondary | ICD-10-CM

## 2016-02-14 DIAGNOSIS — Z98891 History of uterine scar from previous surgery: Secondary | ICD-10-CM

## 2016-02-14 DIAGNOSIS — F322 Major depressive disorder, single episode, severe without psychotic features: Secondary | ICD-10-CM

## 2016-02-14 DIAGNOSIS — R45851 Suicidal ideations: Secondary | ICD-10-CM

## 2016-02-14 DIAGNOSIS — Z79899 Other long term (current) drug therapy: Secondary | ICD-10-CM

## 2016-02-14 DIAGNOSIS — F329 Major depressive disorder, single episode, unspecified: Secondary | ICD-10-CM | POA: Diagnosis present

## 2016-02-14 MED ORDER — MAGNESIUM HYDROXIDE 400 MG/5ML PO SUSP: 30.0000 mL | Freq: Every day | ORAL | Status: DC | PRN

## 2016-02-14 MED ORDER — IBUPROFEN 600 MG PO TABS: 600.0000 mg | ORAL_TABLET | Freq: Three times a day (TID) | ORAL | Status: DC | PRN

## 2016-02-14 MED ORDER — ALUM & MAG HYDROXIDE-SIMETH 200-200-20 MG/5ML PO SUSP: 30.0000 mL | ORAL | Status: DC | PRN

## 2016-02-14 MED ORDER — ONDANSETRON HCL 4 MG PO TABS: 4.0000 mg | ORAL_TABLET | Freq: Three times a day (TID) | ORAL | Status: DC | PRN

## 2016-02-14 MED ORDER — ACETAMINOPHEN 325 MG PO TABS: 650.0000 mg | ORAL_TABLET | ORAL | Status: DC | PRN

## 2016-02-14 NOTE — BH Assessment (Signed)
[facility] Assessment Progress Note  Per Suzanne MinsMojeed Akintayo, MD, this pt requires psychiatric hospitalization.  Suzanne Heinrichina Tate, RN, [facility]; they will be ready to receive pt at 13:00. Dr Suzanne Flowers has initiated IVC, and [place]; at 11:18 Burna CashMagistrate Morton confirms receipt.  As of this writing service of Findings and Custody Order is pending, but other [facility].  Pt's nurse, Suzanne Flowers, has been notified, and agrees to call report to [phone].  Pt is to be transported via [employer] enforcement.   Suzanne Canninghomas Cottrell Gentles, MA Triage Specialist [phone]

## 2016-02-14 NOTE — Progress Notes (Signed)
Patient denied SI and HI, contracts for safety.  Denied A/V hallucinations.  Denied pain. Respirations even and unlabored.  No signs/symptoms of pain/distress noted on patient's face/body movements.   Safety maintained with 15 minute checks.  

## 2016-02-14 NOTE — ED Notes (Signed)
[facility] She was calm and cooperative. All belongings returned to pt who signed for same. Her mother was present and continued to be supportive.

## 2016-02-14 NOTE — Tx Team (Signed)
Initial Treatment Plan 02/14/2016 3:30 PM Suzanne BogaKanisha S Tumbleson ZOX:[id number]    PATIENT STRESSORS: Loss of boyfriend to new relationship Other: Stress of taking care of 3 children under the age of 3   PATIENT STRENGTHS: Communication skills General fund of knowledge Motivation for treatment/growth   PATIENT IDENTIFIED PROBLEMS: Depression  Anxiety  Suicidal ideation  "Learn how to control my emotions before I explode"  "Feel better about myself"             DISCHARGE CRITERIA:  Improved stabilization in mood, thinking, and/or behavior Verbal commitment to aftercare and medication compliance  PRELIMINARY DISCHARGE PLAN: Outpatient therapy Medication management  PATIENT/FAMILY INVOLVEMENT: This treatment plan has been presented to and reviewed with the patient, Suzanne Flowers.  The patient and family have been given the opportunity to ask questions and make suggestions.  Levin BaconHeather V Calvyn Kurtzman, RN 02/14/2016, 3:30 PM

## 2016-02-14 NOTE — Progress Notes (Signed)
Suzanne Flowers is a 25 year old female being admitted involuntarily to [facility] from [facility].  She was brought into the ED via police after being found crying.  She reported that she took 6 oxycodone in attempt to harm self.  She reported stress due to having 3 children under the age of 613.  She reports that she has been thinking about hurting herself for a long time with history of wanting to shoot herself in the past.  She reported that she was upset about the fact that the father of her children told her yesterday (10/08) that he was going to start a relationship with another woman.  She was so upset today and took the children to the father's workplace and told him she "could not do it anymore."  She reported that 3-4 months ago she was going to cut her wrists to kill herself but "it was too painful."  She denies HI or A/V hallucinations.  She is diagnosed with Major Depressive Disorder, single episode, severe.  She currently denies SI/HI or A/V hallucinations.  She is able to contract for safety on the unit.  Oriented her to the unit.  Admission paperwork completed and signed.  Belongings searched and secured in locker # 40.  Skin assessment completed and noted scoliosis surgery scar over upper/lower spine, c-section scar and tattoo on right wrist.  Q 15 minute checks initiated for safety.  We will monitor the progress towards her goals.

## 2016-02-14 NOTE — Progress Notes (Signed)
D: Pt denies SI/HI/AVH. Pt is pleasant and cooperative. Pt stated she had a meltdown and could not handle her situation due to her feeling overwhelmed. Pt stated she did feel her and her ex needed to be together , but she realized that this may not be a reality. Pt still loves her ex and said she has a hard time letting go right now, but understands this is what she will have to do to move forward.   A: Pt was offered support and encouragement.  Pt was encourage to attend groups. Q 15 minute checks were done for safety.   R:Pt attends groups and interacts well with peers and staff.  Pt has no complaints.Pt receptive to treatment and safety maintained on unit.

## 2016-02-14 NOTE — [facility] Group Notes (Signed)
Rules of the unit were explained during group and writer asked patients how their day was. Pt stated her day was good. Pt stated it was a 10 because she seen family.

## 2016-02-14 NOTE — ED Notes (Signed)
Pt is very upset that the plan is to admit her to a hospital for further treatment. She is argumentative about it, demanding that the police be called, and insisting that she is going to leave. She is very tearful and disrespectful to staff and not mindful of the boundaries concerning the telephone and visitation.

## 2016-02-14 NOTE — Progress Notes (Signed)
Pt confirms with ED CM she does not have a pcp and continues to have her united health care insurance coverage  Pt with female visitor at bedside   [facility] spoke with pt on how to obtain an in network pcp with insurance coverage via the customer service number or web site  Cm reviewed ED level of care for crisis/emergent services and community pcp level of care to manage continuous or chronic medical concerns.  The pt voiced understanding CM encouraged pt and discussed pt's responsibility to verify with pt's insurance carrier that any recommended medical provider offered by any eme

## 2016-02-14 NOTE — Consult Note (Signed)
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 Major depressive disorder without psychotic features [F32.9] 02/14/2016    Priority: High  . VBAC (vaginal birth after Cesarean) [O34.219] 01/10/2015  . Postpartum hemorrhage [O72.1] 01/10/2015  . Anemia [D64.9] 01/10/2015  . 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 Scoliosis--rods in back.  [M41.9] 01/08/2015  . Previous cesarean section--FTP [Z98.891] 01/08/2015  . History of VBAC 2015 [Z98.891] 01/08/2015  . Short interval between pregnancies affecting pregnancy, antepartum [O09.899] 01/08/2015  . Late prenatal care [O09.30] 01/08/2015    Total Time spent with patient: 45 minutes  Subjective:   Suzanne Flowers is a 25 y.o. female patient admitted with suicide attempt.  HPI:  24 year old woman who denied previous history of mental illness. She was brought to [facility] after she attempted suicide by overdosing on 6 tablets of 5/325 mg of Oxycodone after her babies father told her he was going to start a relationship with another woman. Patient reports being stressed out and increased depressive symptoms since the incident. She is endorsing feeling hopeless, helpless, anhedonia, trouble sleeping, poor appetite and recurrent suicidal ideations with plan to shoot herself. Patient denies delusions, psychosis, drugs and alcohol abuse.  Past Psychiatric History: denied  Risk to Self: Suicidal Ideation: Yes-Currently Present Suicidal Intent: Yes-Currently Present Is patient at risk for suicide?: Yes Suicidal Plan?: Yes-Currently Present Specify Current Suicidal Plan: Overdose on oxycodone Access to Means: Yes Specify Access to Suicidal Means: Medications What has been your use of  drugs/alcohol within the last 12 months?: Pt denies How many times?: 1 Other Self Harm Risks: None Triggers for Past Attempts: Spouse contact (Baby father) Intentional Self Injurious Behavior: None Risk to Others: Homicidal Ideation: No Thoughts of Harm to Others: No Current Homicidal Intent: No Current Homicidal Plan: No Access to Homicidal Means: No Identified Victim: No one History of harm to others?: No Assessment of Violence: None Noted Violent Behavior Description: None reported Does patient have access to weapons?: No Criminal Charges Pending?: No Does patient have a court date: No Prior Inpatient Therapy: Prior Inpatient Therapy: No Prior Therapy Dates: N/A Prior Therapy Facilty/Provider(s): N/A Reason for Treatment: N/A Prior Outpatient Therapy: Prior Outpatient Therapy: No Prior Therapy Dates: N/a Prior Therapy Facilty/Provider(s): N/A Reason for Treatment: N/A Does patient have an ACCT team?: No Does patient have Intensive In-House Services?  : No Does patient have [facility] services? : No Does patient have P4CC services?: No  Past Medical History:  Past Medical History:  Diagnosis Date  . Medical history non-contributory   . Pregnancy 01/21/2014  . Scoliosis   . Spontaneous vaginal delivery 01/21/2014    Past Surgical History:  Procedure Laterality Date  . BACK SURGERY    . CESAREAN SECTION N/A 01/18/2013   Procedure: Primary cesarean section with delivery of baby girl at [facility]. Apgars 9/9.;  Surgeon: Marylynn Pearson, MD;  Location: [facility];  Service: Obstetrics;  Laterality: N/A;   Family History:  Family History  Problem Relation Age of Onset  . Diabetes Maternal Grandmother   . Diabetes Paternal Grandmother   . Diabetes Paternal Grandfather    Family Psychiatric  History: Social History:  History  Alcohol Use No     History  Drug Use No    Social History   Social History  .  Marital status: Single    Spouse name: N/A  . Number of children: N/A   . Years of education: N/A   Social History Main Topics  . Smoking status: Never Smoker  . Smokeless tobacco: Never Used  . Alcohol use No  . Drug use: No  . Sexual activity: Yes    Birth control/ protection: None     Comment: last sex 2 months ago   Other Topics Concern  . Not on file   Social History Narrative  . No narrative on file   Additional Social History:    Allergies:  No Known Allergies  Labs:  Results for orders placed or performed during the hospital encounter of 02/13/16 (from the past 48 hour(s))  Comprehensive metabolic panel     Status: Abnormal   Collection Time: 02/13/16  4:08 PM  Result Value Ref Range   Sodium 136 135 - 145 mmol/L   Potassium 3.3 (L) 3.5 - 5.1 mmol/L   Chloride 107 101 - 111 mmol/L   CO2 24 22 - 32 mmol/L   Glucose, Bld 96 65 - 99 mg/dL   BUN 11 6 - 20 mg/dL   Creatinine, Ser 0.71 0.44 - 1.00 mg/dL   Calcium 9.0 8.9 - 10.3 mg/dL   Total Protein 8.3 (H) 6.5 - 8.1 g/dL   Albumin 4.5 3.5 - 5.0 g/dL   AST 17 15 - 41 U/L   ALT 14 14 - 54 U/L   Alkaline Phosphatase 39 38 - 126 U/L   Total Bilirubin 0.6 0.3 - 1.2 mg/dL   GFR calc non Af Amer >60 >60 mL/min   GFR calc Af Amer >60 >60 mL/min    Comment: (NOTE) The eGFR has been calculated using the CKD EPI equation. This calculation has not been validated in all clinical situations. eGFR's persistently <60 mL/min signify possible Chronic Kidney Disease.    Anion gap 5 5 - 15  Ethanol     Status: None   Collection Time: 02/13/16  4:08 PM  Result Value Ref Range   Alcohol, Ethyl (B) <5 <5 mg/dL    Comment:        LOWEST DETECTABLE LIMIT FOR SERUM ALCOHOL IS 5 mg/dL FOR MEDICAL PURPOSES ONLY   Salicylate level     Status: None   Collection Time: 02/13/16  4:08 PM  Result Value Ref Range   Salicylate Lvl <9.5 2.8 - 30.0 mg/dL  Acetaminophen level     Status: None   Collection Time: 02/13/16  4:08 PM  Result Value Ref Range   Acetaminophen (Tylenol), Serum 11 10 - 30 ug/mL     Comment:        THERAPEUTIC CONCENTRATIONS VARY SIGNIFICANTLY. A RANGE OF 10-30 ug/mL MAY BE AN EFFECTIVE CONCENTRATION FOR MANY PATIENTS. HOWEVER, SOME ARE BEST TREATED AT CONCENTRATIONS OUTSIDE THIS RANGE. ACETAMINOPHEN CONCENTRATIONS >150 ug/mL AT 4 HOURS AFTER INGESTION AND >50 ug/mL AT 12 HOURS AFTER INGESTION ARE OFTEN ASSOCIATED WITH TOXIC REACTIONS.   cbc     Status: Abnormal   Collection Time: 02/13/16  4:08 PM  Result Value Ref Range   WBC 3.8 (L) 4.0 - 10.5 K/uL   RBC 4.66 3.87 - 5.11 MIL/uL   Hemoglobin 11.8 (L) 12.0 - 15.0 g/dL   HCT 36.9 36.0 - 46.0 %   MCV 79.2 78.0 - 100.0 fL   MCH 25.3 (L) 26.0 - 34.0 pg   MCHC 32.0 30.0 - 36.0 g/dL   RDW 14.2 11.5 - 15.5 %  Platelets 197 150 - 400 K/uL  I-Stat beta hCG blood, ED     Status: None   Collection Time: 02/13/16  4:16 PM  Result Value Ref Range   I-stat hCG, quantitative <5.0 <5 mIU/mL   Comment 3            Comment:   GEST. AGE      CONC.  (mIU/mL)   <=1 WEEK        5 - 50     2 WEEKS       50 - 500     3 WEEKS       100 - 10,000     4 WEEKS     1,000 - 30,000        FEMALE AND NON-PREGNANT FEMALE:     LESS THAN 5 mIU/mL   Rapid urine drug screen (hospital performed)     Status: None   Collection Time: 02/13/16  4:55 PM  Result Value Ref Range   Opiates NONE DETECTED NONE DETECTED   Cocaine NONE DETECTED NONE DETECTED   Benzodiazepines NONE DETECTED NONE DETECTED   Amphetamines NONE DETECTED NONE DETECTED   Tetrahydrocannabinol NONE DETECTED NONE DETECTED   Barbiturates NONE DETECTED NONE DETECTED    Comment:        DRUG SCREEN FOR MEDICAL PURPOSES ONLY.  IF CONFIRMATION IS NEEDED FOR ANY PURPOSE, NOTIFY LAB WITHIN 5 DAYS.        LOWEST DETECTABLE LIMITS FOR URINE DRUG SCREEN Drug Class       Cutoff (ng/mL) Amphetamine      1000 Barbiturate      200 Benzodiazepine   299 Tricyclics       371 Opiates          300 Cocaine          300 THC              50   Acetaminophen level     Status:  None   Collection Time: 02/13/16  7:19 PM  Result Value Ref Range   Acetaminophen (Tylenol), Serum 17 10 - 30 ug/mL    Comment:        THERAPEUTIC CONCENTRATIONS VARY SIGNIFICANTLY. A RANGE OF 10-30 ug/mL MAY BE AN EFFECTIVE CONCENTRATION FOR MANY PATIENTS. HOWEVER, SOME ARE BEST TREATED AT CONCENTRATIONS OUTSIDE THIS RANGE. ACETAMINOPHEN CONCENTRATIONS >150 ug/mL AT 4 HOURS AFTER INGESTION AND >50 ug/mL AT 12 HOURS AFTER INGESTION ARE OFTEN ASSOCIATED WITH TOXIC REACTIONS.     Current Facility-Administered Medications  Medication Dose Route Frequency Provider Last Rate Last Dose  . acetaminophen (TYLENOL) tablet 650 mg  650 mg Oral Q4H PRN Tatyana Kirichenko, PA-C      . ibuprofen (ADVIL,MOTRIN) tablet 600 mg  600 mg Oral Q8H PRN Tatyana Kirichenko, PA-C      . ondansetron (ZOFRAN) tablet 4 mg  4 mg Oral Q8H PRN Tatyana Kirichenko, PA-C       No current outpatient prescriptions on file.    Musculoskeletal: Strength & Muscle Tone: within normal limits Gait & Station: normal Patient leans: N/A  Psychiatric Specialty Exam: Physical Exam  Psychiatric: Judgment normal. Her affect is blunt. Her speech is delayed. She is slowed and withdrawn. Cognition and memory are normal. She exhibits a depressed mood. She expresses suicidal ideation. She expresses suicidal plans.    Review of Systems  Constitutional: Negative.   HENT: Negative.   Eyes: Negative.   Respiratory: Negative.   Cardiovascular: Negative.   Gastrointestinal: Negative.   Genitourinary:  Negative.   Musculoskeletal: Negative.   Skin: Negative.   Neurological: Negative.   Endo/Heme/Allergies: Negative.   Psychiatric/Behavioral: Positive for depression and suicidal ideas. The patient is nervous/anxious.     Blood pressure 95/64, pulse 71, temperature 98.3 F (36.8 C), temperature source Oral, resp. rate 16, last menstrual period 02/09/2016, SpO2 100 %, unknown if currently breastfeeding.There is no height or  weight on file to calculate BMI.  General Appearance: Disheveled  Eye Contact:  Minimal  Speech:  Clear and Coherent and Slow  Volume:  Decreased  Mood:  Depressed, Dysphoric and Hopeless  Affect:  Constricted  Thought Process:  Coherent and Descriptions of Associations: Intact  Orientation:  Full (Time, Place, and Person)  Thought Content:  Logical  Suicidal Thoughts:  Yes.  with intent/plan  Homicidal Thoughts:  No  Memory:  Immediate;   Fair Recent;   Fair Remote;   Good  Judgement:  Poor  Insight:  Shallow  Psychomotor Activity:  Psychomotor Retardation  Concentration:  Concentration: Fair and Attention Span: Fair  Recall:  Good  Fund of Knowledge:  Good  Language:  Good  Akathisia:  No  Handed:  Right  AIMS (if indicated):     Assets:  Communication Skills Desire for Improvement  ADL's:  Intact  Cognition:  WNL  Sleep:   poor     Treatment Plan Summary: Crisis stabilization Daily contact with patient to assess and evaluate symptoms and progress in treatment Urine pregnancy test before medication management.  Disposition: Recommend psychiatric Inpatient admission when medically cleared. Supportive therapy provided about ongoing stressors. Needs inpatient admission for stabilization.  Corena Pilgrim, MD 02/14/2016 10:46 AM

## 2016-02-15 DIAGNOSIS — Z79899 Other long term (current) drug therapy: Secondary | ICD-10-CM

## 2016-02-15 DIAGNOSIS — R45851 Suicidal ideations: Secondary | ICD-10-CM

## 2016-02-15 DIAGNOSIS — F322 Major depressive disorder, single episode, severe without psychotic features: Principal | ICD-10-CM

## 2016-02-15 DIAGNOSIS — Z833 Family history of diabetes mellitus: Secondary | ICD-10-CM

## 2016-02-15 NOTE — Tx Team (Signed)
Interdisciplinary Treatment and Diagnostic Plan Update  02/15/2016 Time of Session: 3:31 PM  Suzanne Flowers MRN: [id number]  Principal Diagnosis: MDD SINGLE,EPISODE,SEVERE  Secondary Diagnoses: Active Problems:   Major depressive disorder, single episode, severe without psychotic features ([place])   Current Medications:  Current Facility-Administered Medications  Medication Dose Route Frequency Provider Last Rate Last Dose  . acetaminophen (TYLENOL) tablet 650 mg  650 mg Oral Q4H PRN Patrecia Pour, NP      . alum & mag hydroxide-simeth (MAALOX/MYLANTA) 200-200-20 MG/5ML suspension 30 mL  30 mL Oral Q4H PRN Patrecia Pour, NP      . ibuprofen (ADVIL,MOTRIN) tablet 600 mg  600 mg Oral Q8H PRN Patrecia Pour, NP      . magnesium hydroxide (MILK OF MAGNESIA) suspension 30 mL  30 mL Oral Daily PRN Patrecia Pour, NP      . ondansetron [facility]) tablet 4 mg  4 mg Oral Q8H PRN Patrecia Pour, NP        PTA Medications: No prescriptions prior to admission.    Treatment Modalities: Medication Management, Group therapy, Case management,  1 to 1 session with clinician, Psychoeducation, Recreational therapy.  Patient Stressors: Loss of boyfriend to new relationship Other: Stress of taking care of 3 children under the age of 3  Patient Strengths: Curator fund of knowledge Motivation for treatment/growth  Physician Treatment Plan for Primary Diagnosis: MDD SINGLE,EPISODE,SEVERE Long Term Goal(s): Improvement in symptoms so as ready for discharge  Short Term Goals: Ability to identify changes in lifestyle to reduce recurrence of condition will improve Ability to verbalize feelings will improve Ability to disclose and discuss suicidal ideas Ability to demonstrate self-control will improve Ability to identify and develop effective coping behaviors will improve Ability to maintain clinical measurements within normal limits will improve Compliance with prescribed  medications will improve Ability to identify triggers associated with substance abuse/mental health issues will improve   Medication Management: Evaluate patient's response, side effects, and tolerance of medication regimen.  Therapeutic Interventions: 1 to 1 sessions, Unit Group sessions and Medication administration.  Evaluation of Outcomes: Not Met  Physician Treatment Plan for Secondary Diagnosis: Active Problems:   Major depressive disorder, single episode, severe without psychotic features ([place])   Long Term Goal(s): Improvement in symptoms so as ready for discharge  Short Term Goals: Ability to identify changes in lifestyle to reduce recurrence of condition will improve Ability to verbalize feelings will improve Ability to disclose and discuss suicidal ideas Ability to demonstrate self-control will improve Ability to identify and develop effective coping behaviors will improve Ability to maintain clinical measurements within normal limits will improve Compliance with prescribed medications will improve Ability to identify triggers associated with substance abuse/mental health issues will improve  Medication Management: Evaluate patient's response, side effects, and tolerance of medication regimen.  Therapeutic Interventions: 1 to 1 sessions, Unit Group sessions and Medication administration.  Evaluation of Outcomes: Not Met   RN Treatment Plan for Primary Diagnosis: MDD SINGLE,EPISODE,SEVERE Long Term Goal(s): Knowledge of disease and therapeutic regimen to maintain health will improve  Short Term Goals: Ability to verbalize feelings will improve, Ability to disclose and discuss suicidal ideas and Ability to identify and develop effective coping behaviors will improve  Medication Management: RN will administer medications as ordered by provider, will assess and evaluate patient's response and provide education to patient for prescribed medication. RN will report any adverse  and/or side effects to prescribing provider.  Therapeutic Interventions: 1 on 1  counseling sessions, Psychoeducation, Medication administration, Evaluate responses to treatment, Monitor vital signs and CBGs as ordered, Perform/monitor CIWA, COWS, AIMS and Fall Risk screenings as ordered, Perform wound care treatments as ordered.  Evaluation of Outcomes: Not Met   LCSW Treatment Plan for Primary Diagnosis: MDD SINGLE,EPISODE,SEVERE Long Term Goal(s): Safe transition to appropriate next level of care at discharge, Engage patient in therapeutic group addressing interpersonal concerns.  Short Term Goals: Engage patient in aftercare planning with referrals and resources, Increase emotional regulation, Facilitate acceptance of mental health diagnosis and concerns and Increase skills for wellness and recovery  Therapeutic Interventions: Assess for all discharge needs, 1 to 1 time with Social worker, Explore available resources and support systems, Assess for adequacy in community support network, Educate family and significant other(s) on suicide prevention, Complete Psychosocial Assessment, Interpersonal group therapy.  Evaluation of Outcomes: Not Met   Progress in Treatment: Attending groups: Yes  Participating in groups: Yes Taking medication as prescribed: Yes, MD continues to assess for medication changes as needed Toleration medication: Yes, no side effects reported at this time Family/Significant other contact made: No, CSW attempting to make contact with father Patient understands diagnosis: Continuing to assess Discussing patient identified problems/goals with staff: Yes Medical problems stabilized or resolved: Yes Denies suicidal/homicidal ideation: Yes Issues/concerns per patient self-inventory: None Other: N/A  New problem(s) identified: None identified at this time.   New Short Term/Long Term Goal(s): None identified at this time.   Discharge Plan or Barriers: Pt will return  home and follow-up with outpatient services.   Reason for Continuation of Hospitalization: Anxiety Depression Medication stabilization Suicidal ideation  Estimated Length of Stay: 2-3 days  Attendees: Patient: 02/15/2016  3:31 PM  Physician: Dr. Ananias Pilgrim 02/15/2016  3:31 PM  Nursing: Pat Kocher 02/15/2016  3:31 PM  RN Care Manager: Lars Pinks, RN 02/15/2016  3:31 PM  Social Worker: Peri Maris, LCSW 02/15/2016  3:31 PM  Recreational Therapist:  02/15/2016  3:31 PM  Other:  Samuel Jester, NP 02/15/2016  3:31 PM  Other:  02/15/2016  3:31 PM  Other: 02/15/2016  3:31 PM    Scribe for Treatment Team: Bo Mcclintock, LCSW 02/15/2016 3:31 PM

## 2016-02-15 NOTE — Progress Notes (Signed)
Patient ID: Suzanne BogaKanisha S Yambao, female   DOB: 30-Jan-1991, 25 y.o.   MRN: [id number] D: Client visible on the unit, denies depression and anxiety, but showing sad affect. Client reports stressors as BF not helping with children ages 1,2.3 yo's. "I think he realized now I need help" "he need to buckle down raise kids like we suppose to" "Hope we can talk it out, work it out" Client reports goal: "learn to cope with emotions", find things to do in the heat of the moment like walking away, taking time to breathe" Client reports another stress reliever "making kids laugh" "I still have my father if I need help with the kids, but I don't feel like he should have to do that, he already raised his kids" A: Writer provided emotional support, encouraged client to consider the fact that the relationship may not work out and using her resources would be helpful. Client encouraged to use coping skills and focus and the well being of children and self. Client reports she wants go back to school for dentistry" Medications reviewed, but client refused. Staff will monitor [id number] for safety. R: Client is safe on the unit, attended group.

## 2016-02-15 NOTE — [facility] Group Notes (Signed)
Patient attend group. Her goal was a 10. He learn how to use emotions to accomplish her goal for today.

## 2016-02-15 NOTE — [facility] Group Notes (Signed)
Patient attend group. Her day was 10. She  Need to learn how to use her emotions and she accomplish her goal.

## 2016-02-15 NOTE — H&P (Signed)
Psychiatric Admission Assessment Adult  Patient Identification: Suzanne Flowers MRN:  [id number] Date of Evaluation:  02/15/2016 Chief Complaint:  MDD SINGLE,EPISODE,SEVERE Principal Diagnosis: <principal problem not specified> Diagnosis:   Patient Active Problem List   Diagnosis Date Noted  . 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 Major depressive disorder, single episode, severe without psychotic features ([place]) [F32.2] 02/14/2016  . VBAC (vaginal birth after Cesarean) [O34.219] 01/10/2015  . Postpartum hemorrhage [O72.1] 01/10/2015  . Anemia [D64.9] 01/10/2015  . 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 Scoliosis--rods in back.  [M41.9] 01/08/2015  . Previous cesarean section--FTP [Z98.891] 01/08/2015  . History of VBAC 2015 [Z98.891] 01/08/2015  . Short interval between pregnancies affecting pregnancy, antepartum [O09.899] 01/08/2015  . Late prenatal care [O09.30] 01/08/2015  .Hiistory of Present Illness:Suzanne  Flowers is a 25 y.o. female who  presented to emergency department complaining of  Feeling overwhelmed with caring for her 3 children alone,multi[ple depressive symptoms including suicidal thoughts.. Patient reports generalized stress, she states she has 3 children under age of 65. She reports that she has been thinking about hurting herself for a long time. She states that she has had thoughts about cutting herself in the past but it hurt too much so she ended up with a scratch. Two days ago she took her children to thier father's, told him that "I just can't do it anymore," and was found by police crying by a daycare after she had taken  6 tablets of 5/39m oxycodone tablet which were leftover from a previous cesearian section in 2014. Patient admitted to taking 6 oxycodone 5/325 mg tablets about an hour and a half  prior to the ED arrival. Patient  admitted taking this medicine order to harm herself to the ED Staff but now says it was to feel numb.  Patient denies any specific events that would've triggered this other  A chronic sense of being overwhelmed and to have the kids's have a taste of how it feels to take care of 3 kids. She denies drugs or alcohol. She denies any other medical problems  Associated Signs/Symptoms: Depression Symptoms:  depressed mood, suicidal thoughts with specific plan, suicidal attempt, (Hypo) Manic Symptoms:  None Anxiety Symptoms:  None Psychotic Symptoms:  None PTSD Symptoms: NoneTotal Time spent with patient: 1 hour  Past Psychiatric History: None reported  Is the patient at risk to self?Yes  Has the patient been a risk to self in the past 6 months? Yes Has the patient been a risk to self within the distant past? Yes.    Is the patient a risk to others? No.  Has the patient been a risk to others in the past 6 months? No.  Has the patient been a risk to others within the distant past? No.   Prior Inpatient Therapy:   Prior Outpatient Therapy:    Alcohol Screening: 1. How often do you have a drink containing alcohol?: Never 9. Have you or someone else been injured as a result of your drinking?: No 10. Has a relative or friend or a doctor or another health worker been concerned about your drinking or suggested you cut down?: No Alcohol Use Disorder Identification Test Final Score (AUDIT): 0 Brief Intervention: AUDIT score less than 7 or less-screening does not suggest unhealthy drinking-brief intervention not indicated Substance Abuse History in the last 12 months:  No. Consequences of Substance Abuse: NA Previous Psychotropic Medications: No  Psychological Evaluations: No  Past Medical History:  Past Medical History:  Diagnosis Date  . Medical history non-contributory   . Pregnancy 01/21/2014  . Scoliosis   . Spontaneous vaginal delivery 01/21/2014    Past Surgical History:  Procedure Laterality Date  . BACK SURGERY    . CESAREAN SECTION N/A 01/18/2013   Procedure: Primary cesarean section  with delivery of baby girl at [facility]. Apgars 9/9.;  Surgeon: Marylynn Pearson, MD;  Location: [facility];  Service: Obstetrics;  Laterality: N/A;   Family History:  Family History  Problem Relation Age of Onset  . Diabetes Maternal Grandmother   . Diabetes Paternal Grandmother   . Diabetes Paternal Grandfather    Family Psychiatric  History: Denies Tobacco Screening: Have you used any form of tobacco in the last 30 days? (Cigarettes, Smokeless Tobacco, Cigars, and/or Pipes): No Social History:  History  Alcohol Use No     History  Drug Use No    Additional Social History:      Pain Medications: Pt had some oxycodone which she attempted to overdose on. Prescriptions: See PTA medication list Over the Counter: See PTA medication list History of alcohol / drug use?: No history of alcohol / drug abuse                    Allergies:  No Known Allergies Lab Results:  Results for orders placed or performed during the hospital encounter of 02/13/16 (from the past 48 hour(s))  Comprehensive metabolic panel     Status: Abnormal   Collection Time: 02/13/16  4:08 PM  Result Value Ref Range   Sodium 136 135 - 145 mmol/L   Potassium 3.3 (L) 3.5 - 5.1 mmol/L   Chloride 107 101 - 111 mmol/L   CO2 24 22 - 32 mmol/L   Glucose, Bld 96 65 - 99 mg/dL   BUN 11 6 - 20 mg/dL   Creatinine, Ser 0.71 0.44 - 1.00 mg/dL   Calcium 9.0 8.9 - 10.3 mg/dL   Total Protein 8.3 (H) 6.5 - 8.1 g/dL   Albumin 4.5 3.5 - 5.0 g/dL   AST 17 15 - 41 U/L   ALT 14 14 - 54 U/L   Alkaline Phosphatase 39 38 - 126 U/L   Total Bilirubin 0.6 0.3 - 1.2 mg/dL   GFR calc non Af Amer >60 >60 mL/min   GFR calc Af Amer >60 >60 mL/min    Comment: (NOTE) The eGFR has been calculated using the CKD EPI equation. This calculation has not been validated in all clinical situations. eGFR's persistently <60 mL/min signify possible Chronic Kidney Disease.    Anion gap 5 5 - 15  Ethanol     Status: None   Collection Time:  02/13/16  4:08 PM  Result Value Ref Range   Alcohol, Ethyl (B) <5 <5 mg/dL    Comment:        LOWEST DETECTABLE LIMIT FOR SERUM ALCOHOL IS 5 mg/dL FOR MEDICAL PURPOSES ONLY   Salicylate level     Status: None   Collection Time: 02/13/16  4:08 PM  Result Value Ref Range   Salicylate Lvl <1.8 2.8 - 30.0 mg/dL  Acetaminophen level     Status: None   Collection Time: 02/13/16  4:08 PM  Result Value Ref Range   Acetaminophen (Tylenol), Serum 11 10 - 30 ug/mL    Comment:        THERAPEUTIC CONCENTRATIONS VARY SIGNIFICANTLY. A RANGE OF 10-30 ug/mL MAY BE AN EFFECTIVE CONCENTRATION FOR MANY PATIENTS. HOWEVER, SOME ARE BEST TREATED AT CONCENTRATIONS  OUTSIDE THIS RANGE. ACETAMINOPHEN CONCENTRATIONS >150 ug/mL AT 4 HOURS AFTER INGESTION AND >50 ug/mL AT 12 HOURS AFTER INGESTION ARE OFTEN ASSOCIATED WITH TOXIC REACTIONS.   cbc     Status: Abnormal   Collection Time: 02/13/16  4:08 PM  Result Value Ref Range   WBC 3.8 (L) 4.0 - 10.5 K/uL   RBC 4.66 3.87 - 5.11 MIL/uL   Hemoglobin 11.8 (L) 12.0 - 15.0 g/dL   HCT 36.9 36.0 - 46.0 %   MCV 79.2 78.0 - 100.0 fL   MCH 25.3 (L) 26.0 - 34.0 pg   MCHC 32.0 30.0 - 36.0 g/dL   RDW 14.2 11.5 - 15.5 %   Platelets 197 150 - 400 K/uL  I-Stat beta hCG blood, ED     Status: None   Collection Time: 02/13/16  4:16 PM  Result Value Ref Range   I-stat hCG, quantitative <5.0 <5 mIU/mL   Comment 3            Comment:   GEST. AGE      CONC.  (mIU/mL)   <=1 WEEK        5 - 50     2 WEEKS       50 - 500     3 WEEKS       100 - 10,000     4 WEEKS     1,000 - 30,000        FEMALE AND NON-PREGNANT FEMALE:     LESS THAN 5 mIU/mL   Rapid urine drug screen (hospital performed)     Status: None   Collection Time: 02/13/16  4:55 PM  Result Value Ref Range   Opiates NONE DETECTED NONE DETECTED   Cocaine NONE DETECTED NONE DETECTED   Benzodiazepines NONE DETECTED NONE DETECTED   Amphetamines NONE DETECTED NONE DETECTED   Tetrahydrocannabinol NONE  DETECTED NONE DETECTED   Barbiturates NONE DETECTED NONE DETECTED    Comment:        DRUG SCREEN FOR MEDICAL PURPOSES ONLY.  IF CONFIRMATION IS NEEDED FOR ANY PURPOSE, NOTIFY LAB WITHIN 5 DAYS.        LOWEST DETECTABLE LIMITS FOR URINE DRUG SCREEN Drug Class       Cutoff (ng/mL) Amphetamine      1000 Barbiturate      200 Benzodiazepine   191 Tricyclics       478 Opiates          300 Cocaine          300 THC              50   Acetaminophen level     Status: None   Collection Time: 02/13/16  7:19 PM  Result Value Ref Range   Acetaminophen (Tylenol), Serum 17 10 - 30 ug/mL    Comment:        THERAPEUTIC CONCENTRATIONS VARY SIGNIFICANTLY. A RANGE OF 10-30 ug/mL MAY BE AN EFFECTIVE CONCENTRATION FOR MANY PATIENTS. HOWEVER, SOME ARE BEST TREATED AT CONCENTRATIONS OUTSIDE THIS RANGE. ACETAMINOPHEN CONCENTRATIONS >150 ug/mL AT 4 HOURS AFTER INGESTION AND >50 ug/mL AT 12 HOURS AFTER INGESTION ARE OFTEN ASSOCIATED WITH TOXIC REACTIONS.     Blood Alcohol level:  Lab Results  Component Value Date   ETH <5 29/56/2130    Metabolic Disorder Labs:  No results found for: HGBA1C, MPG No results found for: PROLACTIN No results found for: CHOL, TRIG, HDL, CHOLHDL, VLDL, LDLCALC  Current Medications: Current Facility-Administered Medications  Medication Dose Route Frequency Provider  Last Rate Last Dose  . acetaminophen (TYLENOL) tablet 650 mg  650 mg Oral Q4H PRN Patrecia Pour, NP      . alum & mag hydroxide-simeth (MAALOX/MYLANTA) 200-200-20 MG/5ML suspension 30 mL  30 mL Oral Q4H PRN Patrecia Pour, NP      . ibuprofen (ADVIL,MOTRIN) tablet 600 mg  600 mg Oral Q8H PRN Patrecia Pour, NP      . magnesium hydroxide (MILK OF MAGNESIA) suspension 30 mL  30 mL Oral Daily PRN Patrecia Pour, NP      . ondansetron [facility]) tablet 4 mg  4 mg Oral Q8H PRN Patrecia Pour, NP       PTA Medications: No prescriptions prior to admission.    Musculoskeletal: Strength & Muscle Tone:  within normal limits Gait & Station: normal Patient leans: N/A  Psychiatric Specialty Exam: Physical Exam  ROS  Blood pressure 108/68, pulse 96, temperature 99 F (37.2 C), temperature source Oral, resp. rate 18, height 5' (1.524 m), weight 54.7 kg (120 lb 8 oz), last menstrual period 02/09/2016, SpO2 100 %, unknown if currently breastfeeding.Body mass index is 23.53 kg/m.  General Appearance: Casual  Eye Contact:  Good  Speech:  Clear and Coherent and Normal Rate  Volume:  Normal  Mood:  Depressed  Affect:  Depressed  Thought Process:  Coherent and Goal Directed  Orientation:  Full (Time, Place, and Person)  Thought Content:  Logical  Suicidal Thoughts:  Yes.  with intent/plan  Homicidal Thoughts:  No  Memory:  Immediate;   Fair Recent;   Fair Remote;   Fair  Judgement:  Impaired  Insight:  Lacking  Psychomotor Activity:  Normal  Concentration:  Concentration: Fair and Attention Span: Fair  Recall:  [employer] of Knowledge:  Fair  Language:  Fair  Akathisia:  No  Handed:  Right  AIMS (if indicated):     Assets:  Communication Skills Desire for Improvement Financial Resources/Insurance Housing Intimacy Physical Health Social Support Transportation  ADL's:  Intact  Cognition:  WNL  Sleep:  Number of Hours: 7    Treatment Plan Summary: Daily contact with patient to assess and evaluate symptoms and progress in treatment and Medication management Patient will participate in group, milieu and individual activities Observation Level/Precautions:  15 minute checks  Laboratory:  none  Psychotherapy:  Agrees to pursue therapy on discharge  Medications:  Patient is refusing medicatons   Consultations:    Discharge Concerns:    Estimated LOS:5 days  Other:     Physician Treatment Plan for Primary Diagnosis: <principal problem not specified> Long Term Goal(s): Improvement in symptoms so as ready for discharge  Short Term Goals: Ability to identify changes in  lifestyle to reduce recurrence of condition will improve, Ability to verbalize feelings will improve, Ability to disclose and discuss suicidal ideas, Ability to demonstrate self-control will improve, Ability to identify and develop effective coping behaviors will improve, Ability to maintain clinical measurements within normal limits will improve, Compliance with prescribed medications will improve and Ability to identify triggers associated with substance abuse/mental health issues will improve  Physician Treatment Plan for Secondary Diagnosis: Active Problems:   Major depressive disorder, single episode, severe without psychotic features ([place])  Long Term Goal(s): Improvement in symptoms so as ready for discharge  Short Term Goals: Ability to identify changes in lifestyle to reduce recurrence of condition will improve, Ability to verbalize feelings will improve, Ability to disclose and discuss suicidal ideas, Ability to demonstrate self-control  will improve, Ability to identify and develop effective coping behaviors will improve, Ability to maintain clinical measurements within normal limits will improve, Compliance with prescribed medications will improve and Ability to identify triggers associated with substance abuse/mental health issues will improve  I certify that inpatient services furnished can reasonably be expected to improve the patient's condition.    Ruffin Frederick, MD 10/11/20177:31 AM

## 2016-02-15 NOTE — [facility] Group Notes (Signed)
[facility] LCSW Group Therapy 02/15/2016 1:15 PM  Type of Therapy: Group Therapy- Emotion Regulation  Participation Level: Reserved  Participation Quality:  Appropriate  Affect: Appropriate  Cognitive: Alert and Oriented   Insight:  Developing/Improving  Engagement in Therapy: Developing/Improving and Engaged   Modes of Intervention: Clarification, Confrontation, Discussion, Education, Exploration, Limit-setting, Orientation, Problem-solving, Rapport Building, Dance movement psychotherapisteality Testing, Socialization and Support  Summary of Progress/Problems: The topic for group today was emotional regulation. This group focused on both positive and negative emotion identification and allowed group members to process ways to identify feelings, regulate negative emotions, and find healthy ways to manage internal/external emotions. Group members were asked to reflect on a time when their reaction to an emotion led to a negative outcome and explored how alternative responses using emotion regulation would have benefited them. Group members were also asked to discuss a time when emotion regulation was utilized when a negative emotion was experienced. Pt expressed that how her actions affect others is a factor that aids her in regulating her emotions. Pt reports finding joy in her children's happiness.    Chad CordialLauren Carter, LCSWA 02/15/2016 3:27 PM

## 2016-02-15 NOTE — [facility] Counselor (Signed)
Adult Comprehensive Assessment  Patient ID: Suzanne Flowers, female   DOB: December 19, 1990, 25 y.o.   MRN: [id number]  Information Source: Information source: Patient  Current Stressors:  Educational / Learning stressors: Would like to finish school Employment / Job issues: Not working right now due to not being able to afford child care Family Relationships: can be strained with father of her Social research officer, government / Lack of resources (include bankruptcy): None reported Housing / Lack of housing: None reported Physical health (include injuries & life threatening diseases): None reported Social relationships: None reported Substance abuse: Pt denies Bereavement / Loss: None reported  Living/Environment/Situation:  Living Arrangements: Parent, Children Living conditions (as described by patient or guardian): safe and stable How long has patient lived in current situation?: 1 year What is atmosphere in current home: Supportive, Comfortable  Family History:  Marital status: Single Are you sexually active?: Yes Does patient have children?: Yes How many children?: 3 How is patient's relationship with their children?: good relationship with children  Childhood History:  By whom was/is the patient raised?: Father Description of patient's relationship with caregiver when they were a child: spoiled child; close with father and good relationshipo with mother; stayed with  mom on the weekends Patient's description of current relationship with people who raised him/her: close with father and with mother Does patient have siblings?: Yes Number of Siblings: 2 Description of patient's current relationship with siblings: brothers; close with brothers Did patient suffer any verbal/emotional/physical/sexual abuse as a child?: No Did patient suffer from severe childhood neglect?: No Has patient ever been sexually abused/assaulted/raped as an adolescent or adult?: No Was the patient ever a victim of a  crime or a disaster?: No Witnessed domestic violence?: No Has patient been effected by domestic violence as an adult?: No  Education:  Highest grade of school patient has completed: Some college; has one year left in BS degree Currently a student?: No Learning disability?: No  Employment/Work Situation:   Employment situation: Unemployed What is the longest time patient has a held a job?: 2 months 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 Has patient ever been in the [employer]?: No Has patient ever served in combat?: No Did You Receive Any Psychiatric Treatment/Services While in Equities trader?: No Are There Guns or Other Weapons in Your Home?: No  Financial Resources:   Surveyor, quantity resources: Support from parents / caregiver, Private insurance  Alcohol/Substance Abuse:   What has been your use of drugs/alcohol within the last 12 months?: Pt denies If attempted suicide, did drugs/alcohol play a role in this?: No Alcohol/Substance Abuse Treatment Hx: Denies past history Has alcohol/substance abuse ever caused legal problems?: No  Social Support System:   Conservation officer, nature Support System: Production assistant, radio System: immediate family; children's father Type of faith/religion: None How does patient's faith help to cope with current illness?: n/a  Leisure/Recreation:   Leisure and Hobbies: drawing, running  Strengths/Needs:   What things does the patient do well?: organized, math, science In what areas does patient struggle / problems for patient: controlling your emotions   Discharge Plan:   Does patient have access to transportation?: Yes Will patient be returning to same living situation after discharge?: Yes Currently receiving community mental health services: No If no, would patient like referral for services when discharged?: Yes (What county?) ([place]) Does patient have financial barriers related to discharge medications?:  No  Summary/Recommendations:     Patient is a 25 year old female  with a diagnosis Major Depressive Disorder. Pt presented to the hospital after an intentional overdose. Pt reports primary trigger(s) for admission feeling overwhelmed caring for her small children and relationship conflict. Patient will benefit from crisis stabilization, medication evaluation, group therapy and psycho education in addition to case management for discharge planning. At discharge it is recommended that Pt remain compliant with established discharge plan and continued treatment.   [name]. 02/15/2016

## 2016-02-15 NOTE — [facility] Suicide Risk Assessment (Signed)
[facility]   Nursing information obtained from:  Patient Demographic factors:  Adolescent or young adult, Unemployed Current Mental Status:  NA Loss Factors:  Loss of significant relationship Historical Factors:  Impulsivity Risk Reduction Factors:  Living with another person, especially a relative  Total Time spent with patient: 1 hour Principal Problem: <principal problem not specified> Diagnosis:   Patient Active Problem List   Diagnosis Date Noted  . 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 Major depressive disorder, single episode, severe without psychotic features ([facility]) [F32.2] 02/14/2016  . VBAC (vaginal birth after Cesarean) [O34.219] 01/10/2015  . Postpartum hemorrhage [O72.1] 01/10/2015  . Anemia [D64.9] 01/10/2015  . 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 Scoliosis--rods in back.  [M41.9] 01/08/2015  . Previous cesarean section--FTP [Z98.891] 01/08/2015  . History of VBAC 2015 [Z98.891] 01/08/2015  . Short interval between pregnancies affecting pregnancy, antepartum [O09.899] 01/08/2015  . Late prenatal care [O09.30] 01/08/2015   Subjective Data: See admission assessment  Continued Clinical Symptoms:  Alcohol Use Disorder Identification Test Final Score (AUDIT): 0 The "Alcohol Use Disorders Identification Test", Guidelines for Use in Primary Care, Second Edition.  World Science writerHealth Organization [facility](WHO). Score between 0-7:  no or low risk or alcohol related problems. Score between 8-15:  moderate risk of alcohol related problems. Score between 16-19:  high risk of alcohol related problems. Score 20 or above:  warrants further diagnostic evaluation for alcohol dependence and treatment.   CLINICAL FACTORS:   Depression:   Impulsivity   Musculoskeletal: See admission assessment   Psychiatric Specialty Exam: Physical Exam  Constitutional: She appears well-developed and well-nourished.    Review of Systems   Psychiatric/Behavioral: Positive for depression and suicidal ideas.    Blood pressure 108/68, pulse 96, temperature 99 F (37.2 C), temperature source Oral, resp. rate 18, height 5' (1.524 m), weight 54.7 kg (120 lb 8 oz), last menstrual period 02/09/2016, SpO2 100 %, unknown if currently breastfeeding.Body mass index is 23.53 kg/m.      COGNITIVE FEATURES THAT CONTRIBUTE TO RISK:  None    SUICIDE RISK:   Moderate:  Frequent suicidal ideation with limited intensity, and duration, some specificity in terms of plans, no associated intent, good self-control, limited dysphoria/symptomatology, some risk factors present, and identifiable protective factors, including available and accessible social support.   PLAN OF CARE: see admission assessment  I certify that inpatient services furnished can reasonably be expected to improve the patient's condition.  Wynelle BourgeoisUreh N Lekauwa, MD 02/15/2016, 8:11 AM

## 2016-02-15 NOTE — Progress Notes (Signed)
D: Pt presented anxious on approach. Pt cautious during shift assessment and appeared to be minimizing her symptoms. Pt requested to signed 72 hour request for discharge form this morning during assessment. Writer informed pt that she's unable to sign a 72 hour request for discharge form because she was IVC'd. Pt stated that the next time she becomes overwhelmed with her children she will take them to a relative house so that she can get a break instead of attempting to overdose. A: Pt orders reviewed. Verbal support provided. Pt encouraged to attend groups and participate. 15 minute checks performed for safety.  R: Pt feel that she's stabilized without needing medications.

## 2016-02-15 NOTE — Progress Notes (Signed)
Recreation Therapy Notes  Date: 02/15/16 Time: 0930 Location: [address] Dayroom  Group Topic: Stress Management  Goal Area(s) Addresses:  Patient will verbalize importance of using healthy stress management.  Patient will identify positive emotions associated with healthy stress management.   Behavioral Response: Engaged  Intervention: Stress Management  Activity :  Progressive Muscle Relaxation.  LRT introduced the technique of progressive muscle relaxation.  LRT explained that the technique allows patients to relax muscle groups one at a time.  LRT read a script to guide the patients through the technique.  Pt were to follow along as LRT read script to engage in technique.  Education:  Stress Management, Discharge Planning.   Education Outcome: Acknowledges edcuation/In group clarification offered/Needs additional education  Clinical Observations/Feedback: Pt attended group.    Caroll RancherMarjette Asahel Risden, LRT/CTRS      Caroll RancherLindsay, Temprance Wyre A 02/15/2016 12:51 PM

## 2016-02-16 NOTE — Progress Notes (Signed)
[facility] MD Progress Note  02/16/2016 1:16 PM Suzanne Flowers  MRN:  [id number] Subjective:  Patient has no complaints and reports feeling better . Patient is sleeping well and attending groups. Met with treatment team who reports patient doing well. Rate depressino a 0/10 Principal Problem: <principal problem not specified> Diagnosis:   Patient Active Problem List   Diagnosis Date Noted  . 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 Major depressive disorder, single episode, severe without psychotic features ([place]) [F32.2] 02/14/2016  . VBAC (vaginal birth after Cesarean) [O34.219] 01/10/2015  . Postpartum hemorrhage [O72.1] 01/10/2015  . Anemia [D64.9] 01/10/2015  . 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 Scoliosis--rods in back.  [M41.9] 01/08/2015  . Previous cesarean section--FTP [Z98.891] 01/08/2015  . History of VBAC 2015 [Z98.891] 01/08/2015  . Short interval between pregnancies affecting pregnancy, antepartum [O09.899] 01/08/2015  . Late prenatal care [O09.30] 01/08/2015   Total Time spent with patient: 30 minutes  Past Psychiatric History: see admission assessment  Past Medical History:  Past Medical History:  Diagnosis Date  . Medical history non-contributory   . Pregnancy 01/21/2014  . Scoliosis   . Spontaneous vaginal delivery 01/21/2014    Past Surgical History:  Procedure Laterality Date  . BACK SURGERY    . CESAREAN SECTION N/A 01/18/2013   Procedure: Primary cesarean section with delivery of baby girl at [facility]. Apgars 9/9.;  Surgeon: Marylynn Pearson, MD;  Location: [facility];  Service: Obstetrics;  Laterality: N/A;   Family History:  Family History  Problem Relation Age of Onset  . Diabetes Maternal Grandmother   . Diabetes Paternal Grandmother   . Diabetes Paternal Grandfather    Family Psychiatric  History: see admission assessment Social History:  History  Alcohol Use No     History  Drug Use No    Social History   Social  History  . Marital status: Single    Spouse name: N/A  . Number of children: N/A  . Years of education: N/A   Social History Main Topics  . Smoking status: Never Smoker  . Smokeless tobacco: Never Used  . Alcohol use No  . Drug use: No  . Sexual activity: Yes    Birth control/ protection: Implant     Comment: last sex 2 months ago   Other Topics Concern  . None   Social History Narrative  . None   Additional Social History:    Pain Medications: Pt had some oxycodone which she attempted to overdose on. Prescriptions: See PTA medication list Over the Counter: See PTA medication list History of alcohol / drug use?: No history of alcohol / drug abuse                    Sleep: Fair  Appetite:  Good  Current Medications: Current Facility-Administered Medications  Medication Dose Route Frequency Provider Last Rate Last Dose  . acetaminophen (TYLENOL) tablet 650 mg  650 mg Oral Q4H PRN Patrecia Pour, NP      . alum & mag hydroxide-simeth (MAALOX/MYLANTA) 200-200-20 MG/5ML suspension 30 mL  30 mL Oral Q4H PRN Patrecia Pour, NP      . ibuprofen (ADVIL,MOTRIN) tablet 600 mg  600 mg Oral Q8H PRN Patrecia Pour, NP      . magnesium hydroxide (MILK OF MAGNESIA) suspension 30 mL  30 mL Oral Daily PRN Patrecia Pour, NP      . ondansetron [facility]) tablet 4 mg  4 mg Oral Q8H  PRN Patrecia Pour, NP        Lab Results: No results found for this or any previous visit (from the past 48 hour(s)).  Blood Alcohol level:  Lab Results  Component Value Date   ETH <5 83/41/9622    Metabolic Disorder Labs: No results found for: HGBA1C, MPG No results found for: PROLACTIN No results found for: CHOL, TRIG, HDL, CHOLHDL, VLDL, LDLCALC  Physical Findings: AIMS: Facial and Oral Movements Muscles of Facial Expression: None, normal Lips and Perioral Area: None, normal Jaw: None, normal Tongue: None, normal,Extremity Movements Upper (arms, wrists, hands, fingers): None,  normal Lower (legs, knees, ankles, toes): None, normal, Trunk Movements Neck, shoulders, hips: None, normal, Overall Severity Severity of abnormal movements (highest score from questions above): None, normal Incapacitation due to abnormal movements: None, normal Patient's awareness of abnormal movements (rate only patient's report): No Awareness, Dental Status Current problems with teeth and/or dentures?: No Does patient usually wear dentures?: No  CIWA:    COWS:     Musculoskeletal: Strength & Muscle Tone: within normal limits Gait & Station: normal Patient leans: N/A  Psychiatric Specialty Exam: Physical Exam  ROS  Blood pressure 107/77, pulse (!) 108, temperature 98.2 F (36.8 C), temperature source Oral, resp. rate 18, height 5' (1.524 m), weight 54.7 kg (120 lb 8 oz), last menstrual period 02/09/2016, SpO2 100 %, unknown if currently breastfeeding.Body mass index is 23.53 kg/m.  General Appearance: Casual  Eye Contact:  Good  Speech:  Clear and Coherent and Normal Rate  Volume:  Normal  Mood:  Euthymic  Affect:  Congruent  Thought Process:  Coherent and Goal Directed  Orientation:  Full (Time, Place, and Person)  Thought Content:  Logical  Suicidal Thoughts:  No  Homicidal Thoughts:  No  Memory:  Immediate;   Fair Recent;   Fair Remote;   Fair  Judgement:  Fair  Insight:  Fair  Psychomotor Activity:  Normal  Concentration:  Concentration: Fair and Attention Span: Fair  Recall:  [employer] of Knowledge:  Fair  Language:  Fair  Akathisia:  No  Handed:  Right  AIMS (if indicated):     Assets:  Communication Skills Desire for Improvement Financial Resources/Insurance [place] Support Transportation  ADL's:  Intact  Cognition:  WNL  Sleep:  Number of Hours: 6.5     Treatment Plan Summary: Daily contact with patient to assess and evaluate symptoms and progress in treatment and Medication management [place] continues to  refuse antidepressant and says she only need outpatient therapy Ruffin Frederick, MD 02/16/2016, 1:16 PM

## 2016-02-16 NOTE — Plan of Care (Signed)
Problem: Education: Goal: Ability to state activities that reduce stress will improve Outcome: Progressing Client able to state activities that help reduces stress AEB noting walking away from incidents, deep breathing and playing with children help decrease stress.

## 2016-02-16 NOTE — Progress Notes (Signed)
D:Pt presents with flat affect. Pt reports no depression or anxiety. Pt denies suicidal thoughts and verbally contracts for safety. Pt states that she have her situation with her ex boyfriend/the father of her children under control and have a positive support system set in place. Pt states that she will not risk losing her life nor leaving her children again and will ask for help the next time she's feeling overwhelmed. Pt compliant with group therapy.  A: Orders reviewed. Verbal support provided. 15 minute checks performed for safety. R: Pt receptive to tx.

## 2016-02-16 NOTE — Progress Notes (Signed)
Patient ID: Suzanne Flowers, female   DOB: 1991-04-07, 25 y.o.   MRN: [id number] D: Client visible on the phone, seen on the phone several times. Client reports "excited about talking to family" Client reports admission has been helpful "made me think"  "life is worth living, I'll never leave my babies or do anything like that again" A: Clinical research associateWriter provided emotional support, encouraged client to continue positive affirmations and use resource provided upon discharge as well as family for support. Staff will monitor q6415min for safety. R: Client is safe on the unit, attended karaoke.

## 2016-02-16 NOTE — [facility] Suicide Risk Assessment (Signed)
[facility] INPATIENT:  Family/Significant Other Suicide Prevention Education  Suicide Prevention Education:  Education Completed; Romeo AppleDonald Lakey, Pt's father [phone], has been identified by the patient as the family member/significant other with whom the patient will be residing, and identified as the person(s) who will aid the patient in the event of a mental health crisis (suicidal ideations/suicide attempt).  With written consent from the patient, the family member/significant other has been provided the following suicide prevention education, prior to the and/or following the discharge of the patient.  The suicide prevention education provided includes the following:  Suicide risk factors  Suicide prevention and interventions  National Suicide Hotline telephone number  [facility] assessment telephone number  [facility] Emergency Assistance 911  [facility] and/or Residential Mobile Crisis Unit telephone number  Request made of family/significant other to:  Remove weapons (e.g., guns, rifles, knives), all items previously/currently identified as safety concern.    Remove drugs/medications (over-the-counter, prescriptions, illicit drugs), all items previously/currently identified as a safety concern.  The family member/significant other verbalizes understanding of the suicide prevention education information provided.  The family member/significant other agrees to remove the items of safety concern listed above.  [name] 02/16/2016, 3:09 PM

## 2016-02-16 NOTE — [facility] Group Notes (Signed)
[facility] Group Therapy 02/16/2016 1:15pm  Type of Therapy: Mental Health Association Presentation  Participation Level: Active  Participation Quality: Attentive  Affect: Appropriate  Cognitive: Oriented  Insight: Developing/Improving  Engagement in Therapy: Engaged  Modes of Intervention: Discussion, Education and Socialization  Summary of Progress/Problems: Mental Health Association (MHA) Speaker came to talk about his personal journey with substance abuse and addiction. The pt processed ways by which to relate to the speaker. MHA speaker provided handouts and educational information pertaining to groups and services offered by the [facility]. Pt was engaged in speaker's presentation and was receptive to resources provided.    Chad CordialLauren Carter, LCSWA 02/16/2016 1:49 PM

## 2016-02-16 NOTE — Progress Notes (Signed)
Adult Psychoeducational Group Note  Date:  02/16/2016 Time:  0845   Group Topic/Focus:  Orientation:   The focus of this group is to educate the patient on the purpose and policies of crisis stabilization and provide a format to answer questions about their admission.  The group details unit policies and expectations of patients while admitted.   Participation Level:  Active  Participation Quality:  Appropriate  Affect:  Appropriate  Cognitive:  Appropriate  Insight: Appropriate  Engagement in Group:  Engaged  Modes of Intervention:  Discussion and Education  Additional Comments:    Hiroko Tregre L 02/16/2016, 9:27 AM

## 2016-02-16 NOTE — Plan of Care (Signed)
Problem: Education: Goal: Ability to make informed decisions regarding treatment will improve Outcome: Progressing Pt receptive to group therapy. Pt compliant with attending groups and participating. Pt requesting outpt therapy upon d/c.

## 2016-02-17 NOTE — Tx Team (Signed)
Interdisciplinary Treatment and Diagnostic Plan Update  02/17/2016 Time of Session: 10:10 AM  Suzanne Flowers MRN: [id number]  Principal Diagnosis: MDD SINGLE,EPISODE,SEVERE  Secondary Diagnoses: Active Problems:   Major depressive disorder, single episode, severe without psychotic features (HCC)   Current Medications:  Current Facility-Administered Medications  Medication Dose Route Frequency Provider Last Rate Last Dose  . acetaminophen (TYLENOL) tablet 650 mg  650 mg Oral Q4H PRN Charm Rings, NP      . alum & mag hydroxide-simeth (MAALOX/MYLANTA) 200-200-20 MG/5ML suspension 30 mL  30 mL Oral Q4H PRN Charm Rings, NP      . ibuprofen (ADVIL,MOTRIN) tablet 600 mg  600 mg Oral Q8H PRN Charm Rings, NP      . magnesium hydroxide (MILK OF MAGNESIA) suspension 30 mL  30 mL Oral Daily PRN Charm Rings, NP      . ondansetron [facility] tablet 4 mg  4 mg Oral Q8H PRN Charm Rings, NP        PTA Medications: No prescriptions prior to admission.    Treatment Modalities: Medication Management, Group therapy, Case management,  1 to 1 session with clinician, Psychoeducation, Recreational therapy.  Patient Stressors: Loss of boyfriend to new relationship Other: Stress of taking care of 3 children under the age of 3  Patient Strengths: Wellsite geologist fund of knowledge Motivation for treatment/growth  Physician Treatment Plan for Primary Diagnosis: MDD SINGLE,EPISODE,SEVERE Long Term Goal(s): Improvement in symptoms so as ready for discharge  Short Term Goals: Ability to identify changes in lifestyle to reduce recurrence of condition will improve Ability to verbalize feelings will improve Ability to disclose and discuss suicidal ideas Ability to demonstrate self-control will improve Ability to identify and develop effective coping behaviors will improve Ability to maintain clinical measurements within normal limits will improve Compliance with prescribed  medications will improve Ability to identify triggers associated with substance abuse/mental health issues will improve   Medication Management: Evaluate patient's response, side effects, and tolerance of medication regimen.  Therapeutic Interventions: 1 to 1 sessions, Unit Group sessions and Medication administration.  Evaluation of Outcomes: Adequate for Discharge  Physician Treatment Plan for Secondary Diagnosis: Active Problems:   Major depressive disorder, single episode, severe without psychotic features (HCC)   Long Term Goal(s): Improvement in symptoms so as ready for discharge  Short Term Goals: Ability to identify changes in lifestyle to reduce recurrence of condition will improve Ability to verbalize feelings will improve Ability to disclose and discuss suicidal ideas Ability to demonstrate self-control will improve Ability to identify and develop effective coping behaviors will improve Ability to maintain clinical measurements within normal limits will improve Compliance with prescribed medications will improve Ability to identify triggers associated with substance abuse/mental health issues will improve  Medication Management: Evaluate patient's response, side effects, and tolerance of medication regimen.  Therapeutic Interventions: 1 to 1 sessions, Unit Group sessions and Medication administration.  Evaluation of Outcomes: Adequate for Discharge   RN Treatment Plan for Primary Diagnosis: MDD SINGLE,EPISODE,SEVERE Long Term Goal(s): Knowledge of disease and therapeutic regimen to maintain health will improve  Short Term Goals: Ability to verbalize feelings will improve, Ability to disclose and discuss suicidal ideas and Ability to identify and develop effective coping behaviors will improve  Medication Management: RN will administer medications as ordered by provider, will assess and evaluate patient's response and provide education to patient for prescribed  medication. RN will report any adverse and/or side effects to prescribing provider.  Therapeutic Interventions: 1  on 1 counseling sessions, Psychoeducation, Medication administration, Evaluate responses to treatment, Monitor vital signs and CBGs as ordered, Perform/monitor CIWA, COWS, AIMS and Fall Risk screenings as ordered, Perform wound care treatments as ordered.  Evaluation of Outcomes: Adequate for Discharge   LCSW Treatment Plan for Primary Diagnosis: MDD SINGLE,EPISODE,SEVERE Long Term Goal(s): Safe transition to appropriate next level of care at discharge, Engage patient in therapeutic group addressing interpersonal concerns.  Short Term Goals: Engage patient in aftercare planning with referrals and resources, Increase emotional regulation, Facilitate acceptance of mental health diagnosis and concerns and Increase skills for wellness and recovery  Therapeutic Interventions: Assess for all discharge needs, 1 to 1 time with Social worker, Explore available resources and support systems, Assess for adequacy in community support network, Educate family and significant other(s) on suicide prevention, Complete Psychosocial Assessment, Interpersonal group therapy.  Evaluation of Outcomes: Adequate for Discharge   Progress in Treatment: Attending groups: Yes  Participating in groups: Yes Taking medication as prescribed: Yes, MD continues to assess for medication changes as needed Toleration medication: Yes, no side effects reported at this time Family/Significant other contact made: Yes with father Patient understands diagnosis: Continuing to assess Discussing patient identified problems/goals with staff: Yes Medical problems stabilized or resolved: Yes Denies suicidal/homicidal ideation: Yes Issues/concerns per patient self-inventory: None Other: N/A  New problem(s) identified: None identified at this time.   New Short Term/Long Term Goal(s): None identified at this time.    Discharge Plan or Barriers: Pt will return home and follow-up with outpatient services.   Reason for Continuation of Hospitalization: Anxiety Depression Medication stabilization Suicidal ideation  Estimated Length of Stay: 0 days  Attendees: Patient: 02/17/2016  10:10 AM  Physician: Dr. Seth Bake; Dr. Jama Flavors 02/17/2016  10:10 AM  Nursing: Harriett Rush Thornblom 02/17/2016  10:10 AM  RN Care Manager: Onnie Boer, RN 02/17/2016  10:10 AM  Social Worker: Chad Cordial, LCSW 02/17/2016  10:10 AM  Recreational Therapist:  02/17/2016  10:10 AM  Other:  Gray Bernhardt, NP 02/17/2016  10:10 AM  Other:  02/17/2016  10:10 AM  Other: 02/17/2016  10:10 AM    Scribe for Treatment Team: Elaina Hoops, LCSW 02/17/2016 10:10 AM

## 2016-02-17 NOTE — Progress Notes (Signed)
Pt completed her daily assessment and on it she wrote she deneid SI today and she rated her depression, hopelessness and anxiety " 0/0/0", respectively. Her DC documents are reviewed with her ( AVS, SRA and transition record ) and the suicide safety plan she created is cc'd and reveiwed with her as well. All belongings are returned to her and she  Is escorted to bldg entrance and dc'd.

## 2016-02-17 NOTE — Progress Notes (Signed)
  [facility] :  Will you be returning to the same living situation after discharge:  Yes,  Pt returning home with family' At discharge, do you have transportation home?: Yes,  Pt family to help Pt get to her car Do you have the ability to pay for your medications: Yes,  Pt provided with prescriptions  Release of information consent forms completed and in the chart;  Patient's signature needed at discharge.  Patient to Follow up at: Follow-up Information    [facility] Follow [facility] on 02/22/2016.   Why:  at 10:00am with Morrie SheldonAshley for therapy. [facility]: [facility] [phone] [facility]  [place], [place] [phone] Call [facility] [phone]  Fax: [phone]          Next level of care provider has access to [facility] Link:no  Safety Planning and Suicide Prevention discussed: Yes,  with father; see SPE note  Have you used any form of tobacco in the last 30 days? (Cigarettes, Smokeless Tobacco, Cigars, and/or Pipes): No  Has patient been referred to the Quitline?: N/A patient is not a smoker  Patient has been referred for addiction treatment: Yes  Jenille Laszlo Lavonna RuaM Carter 02/17/2016, 10:14 AM

## 2016-02-17 NOTE — Discharge Summary (Signed)
Physician Discharge Summary Note  Patient:  Suzanne Flowers is an 25 y.o., female MRN:  [id number] DOB:  22-Mar-1991 Patient phone:  [phone] (home)  Patient address:   [address] [place] [address],  Total Time spent with patient: 30 minutes  Date of Admission:  02/14/2016 Date of Discharge: 02/17/2016  Reason for Admission:  Suicidal thoughts    Principal Problem: Major depressive disorder, single episode, severe without psychotic features [facility]) Discharge Diagnoses: Patient Active Problem List   Diagnosis Date Noted  . Major depressive disorder, single episode, severe without psychotic features (HCC) [F32.2] 02/14/2016    Priority: High  . 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 VBAC (vaginal birth after Cesarean) [O34.219] 01/10/2015  . Postpartum hemorrhage [O72.1] 01/10/2015  . Anemia [D64.9] 01/10/2015  . 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 Scoliosis--rods in back.  [M41.9] 01/08/2015  . Previous cesarean section--FTP [Z98.891] 01/08/2015  . History of VBAC 2015 [Z98.891] 01/08/2015  . Short interval between pregnancies affecting pregnancy, antepartum [O09.899] 01/08/2015  . Late prenatal care [O09.30] 01/08/2015    Past Psychiatric History: see HPI  Past Medical History:  Past Medical History:  Diagnosis Date  . Medical history non-contributory   . Pregnancy 01/21/2014  . Scoliosis   . Spontaneous vaginal delivery 01/21/2014    Past Surgical History:  Procedure Laterality Date  . BACK SURGERY    . CESAREAN SECTION N/A 01/18/2013   Procedure: Primary cesarean section with delivery of baby girl at 1826. Apgars 9/9.;  Surgeon: Zelphia Cairo, MD;  Location: [facility];  Service: Obstetrics;  Laterality: N/A;   Family History:  Family History  Problem Relation Age of Onset  . Diabetes Maternal Grandmother   . Diabetes Paternal Grandmother   . Diabetes Paternal Grandfather    Family Psychiatric  History: see HPI Social  History:  History  Alcohol Use No     History  Drug Use No    Social History   Social History  . Marital status: Single    Spouse name: N/A  . Number of children: N/A  . Years of education: N/A   Social History Main Topics  . Smoking status: Never Smoker  . Smokeless tobacco: Never Used  . Alcohol use No  . Drug use: No  . Sexual activity: Yes    Birth control/ protection: Implant     Comment: last sex 2 months ago   Other Topics Concern  . None   Social History Narrative  . None    Hospital Course:  Suzanne  Flowers a 25 y.o.female who presented to emergency department complaining of feeling overwhelmed with caring for her 3 children alone and she developed suicidal thoughts.  Patient had taken  6 tablets of 5/325mg  Oxycodone tablet.    Suzanne Flowers was admitted for Major depressive disorder, single episode, severe without psychotic features (HCC) and crisis management.  Patient was treated with medications with their indications listed below in detail under Medication List.  Medical problems were identified and treated as needed.  Home medications were restarted as appropriate.  Improvement was monitored by observation and Suzanne Flowers daily report of symptom reduction.  Emotional and mental status was monitored by daily self inventory reports completed by Suzanne Flowers and clinical staff.  Patient reported continued improvement, denied any new concerns.  Patient had been compliant on medications and denied side effects.  Support and encouragement was provided.    Patient encouraged to attend groups to help with  recognizing triggers of emotional crises and de-stabilizations.  Patient encouraged to attend group to help identify the positive things in life that would help in dealing with feelings of loss, depression and unhealthy or abusive tendencies.         Suzanne BogaKanisha S Flowers was evaluated by the treatment team for stability and plans for continued recovery  upon discharge.  Patient was offered further treatment options upon discharge including Residential, Intensive Outpatient and Outpatient treatment. Patient will follow up with agency listed below for medication management and counseling.  Encouraged patient to maintain satisfactory support network and home environment.  Advised to adhere to medication compliance and outpatient treatment follow up.  Prescriptions provided.       Suzanne BogaKanisha S Suzanne Flowers motivation was an integral factor for scheduling further treatment.  Employment, transportation, bed availability, health status, family support, and any pending legal issues were also considered during patient's hospital stay.  Upon completion of this admission the patient was both mentally and medically stable for discharge denying suicidal/homicidal ideation, auditory/visual/tactile hallucinations, delusional thoughts and paranoia.          Physical Findings: AIMS: Facial and Oral Movements Muscles of Facial Expression: None, normal Lips and Perioral Area: None, normal Jaw: None, normal Tongue: None, normal,Extremity Movements Upper (arms, wrists, hands, fingers): None, normal Lower (legs, knees, ankles, toes): None, normal, Trunk Movements Neck, shoulders, hips: None, normal, Overall Severity Severity of abnormal movements (highest score from questions above): None, normal Incapacitation due to abnormal movements: None, normal Patient's awareness of abnormal movements (rate only patient's report): No Awareness, Dental Status Current problems with teeth and/or dentures?: No Does patient usually wear dentures?: No  CIWA:    COWS:     Musculoskeletal: Strength & Muscle Tone: within normal limits Gait & Station: normal Patient leans: N/A  Psychiatric Specialty Exam: Physical Exam  Nursing note and vitals reviewed. Psychiatric: She has a normal mood and affect. Her behavior is normal. Judgment and thought content normal. Thought content is not  paranoid. She expresses no homicidal and no suicidal ideation.    Review of Systems  Constitutional: Negative.   HENT: Negative.   Eyes: Negative.   Respiratory: Negative.   Cardiovascular: Negative.   Gastrointestinal: Negative.   Genitourinary: Negative.   Musculoskeletal: Negative.   Skin: Negative.   Neurological: Negative.   Endo/Heme/Allergies: Negative.   Psychiatric/Behavioral: Negative for depression and suicidal ideas. The patient is not nervous/anxious.     Blood pressure 116/66, pulse (!) 103, temperature 98.7 F (37.1 C), temperature source Oral, resp. rate 18, height 5' (1.524 m), weight 54.7 kg (120 lb 8 oz), last menstrual period 02/09/2016, SpO2 100 %, unknown if currently breastfeeding.Body mass index is 23.53 kg/m.   Have you used any form of tobacco in the last 30 days? (Cigarettes, Smokeless Tobacco, Cigars, and/or Pipes): No  Has this patient used any form of tobacco in the last 30 days? (Cigarettes, Smokeless Tobacco, Cigars, and/or Pipes) Yes, N/A  Blood Alcohol level:  Lab Results  Component Value Date   ETH <5 02/13/2016    Metabolic Disorder Labs:  No results found for: HGBA1C, MPG No results found for: PROLACTIN No results found for: CHOL, TRIG, HDL, CHOLHDL, VLDL, LDLCALC  See Psychiatric Specialty Exam and Suicide Risk Assessment completed by Attending Physician prior to discharge.  Discharge destination:  Home  Is patient on multiple antipsychotic therapies at discharge:  No   Has Patient had three or more failed trials of antipsychotic monotherapy by history:  No  Recommended Plan for Multiple Antipsychotic Therapies: NA    Medication List    You have not been prescribed any medications.    Follow-up Information    [facility] Follow up on 02/22/2016.   Why:  at 10:00am with Morrie Sheldon for therapy. Contact information: [facility] [phone] [facility]  [place], [place]  [address] Call [place] [phone]  Fax: [phone]          Follow-up recommendations:  Activity:  as tol Diet:  as tol  Comments:  1.  Take all your medications as prescribed.   2.  Report any adverse side effects to outpatient provider. 3.  Patient instructed to not use alcohol or illegal drugs while on prescription medicines. 4.  In the event of worsening symptoms, instructed patient to call 911, the crisis hotline or go to nearest emergency room for evaluation of symptoms.  Signed: Lindwood Qua, NP [facility] 02/17/2016, 1:43 PM

## 2016-02-17 NOTE — [facility] Suicide Risk Assessment (Signed)
[facility]   Principal Problem: Depression Discharge Diagnoses:  Patient Active Problem List   Diagnosis Date Noted  . 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 Major depressive disorder, single episode, severe without psychotic features (HCC) [F32.2] 02/14/2016  . VBAC (vaginal birth after Cesarean) [O34.219] 01/10/2015  . Postpartum hemorrhage [O72.1] 01/10/2015  . Anemia [D64.9] 01/10/2015  . 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 Scoliosis--rods in back.  [M41.9] 01/08/2015  . Previous cesarean section--FTP [Z98.891] 01/08/2015  . History of VBAC 2015 [Z98.891] 01/08/2015  . Short interval between pregnancies affecting pregnancy, antepartum [O09.899] 01/08/2015  . Late prenatal care [O09.30] 01/08/2015    Total Time spent with patient: 30 minutes  Musculoskeletal: Strength & Muscle Tone: within normal limits Gait & Station: normal Patient leans: N/A  Psychiatric Specialty Exam: ROS  Blood pressure 116/66, pulse (!) 103, temperature 98.7 F (37.1 C), temperature source Oral, resp. rate 18, height 5' (1.524 m), weight 54.7 kg (120 lb 8 oz), last menstrual period 02/09/2016, SpO2 100 %, unknown if currently breastfeeding.Body mass index is 23.53 kg/m.  General Appearance: Casual  Eye Contact::  Good  Speech:  Clear and Coherent and Normal Rate409  Volume:  Normal  Mood:  Euthymic  Affect:  Congruent  Thought Process:  Coherent and Goal Directed  Orientation:  Full (Time, Place, and Person)  Thought Content:  Logical  Suicidal Thoughts:  No  Homicidal Thoughts:  No  Memory:  Immediate;   Good Recent;   Good Remote;   Good  Judgement:  Fair  Insight:  Fair  Psychomotor Activity:  Normal  Concentration:  Good  Recall:  Good  Fund of Knowledge:Fair  Language: Fair  Akathisia:  No  Handed:  Right  AIMS (if indicated):     Assets:  Communication Skills Desire for Improvement Financial  Resources/Insurance Housing Physical Health Social Support Transportation  Sleep:  Number of Hours: 6.75  Cognition: WNL  ADL's:  Intact   Mental Status Per Nursing Assessment::   On Admission:  NA  Demographic Factors:  Adolescent or young adult  Loss Factors: NA  Historical Factors: Prior suicide attempts and Impulsivity  Risk Reduction Factors:   Responsible for children under 25 years of age, Sense of responsibility to family, Living with another person, especially a relative and Positive social support  Continued Clinical Symptoms:  None  Cognitive Features That Contribute To Risk:  None    Suicide Risk:  Minimal: No identifiable suicidal ideation.  Patients presenting with no risk factors but with morbid ruminations; may be classified as minimal risk based on the severity of the depressive symptoms  Follow-up Information    [facility] Follow up on 02/22/2016.   Why:  at 10:00am with Morrie SheldonAshley for therapy. [facility] [phone] [facility]  [place] [facility], [place] [phone] Call [facility] [phone]  Fax: [phone]          Plan Of Care/Follow-up recommendations:  Activity:  Patient will follow up with an outpatient therapist and does not ant medications  Wynelle BourgeoisUreh N Lekauwa, MD 02/17/2016, 10:14 AM

## 2016-02-17 NOTE — Progress Notes (Signed)
Recreation Therapy Notes  Date: 02/17/16 Time: 0930 Location: [address] Dayroom  Group Topic: Stress Management  Goal Area(s) Addresses:  Patient will verbalize importance of using healthy stress management.  Patient will identify positive emotions associated with healthy stress management.   Intervention: Guided Imagery Script  Activity :  Forest Visualization.  LRT introduced the stress management technique of guided imagery.  LRT read Flowers script that allowed patients to participate in the activity.  Patients were to follow along as LRT read script.    Education:  Stress Management, Discharge Planning.   Education Outcome: Acknowledges edcuation/In group clarification offered/Needs additional education  Clinical Observations/Feedback: Pt did not attend group.     Deronda Christian, LRT/CTRS         Suzanne Flowers 02/17/2016 12:46 PM
